# Patient Record
Sex: Female | Born: 1935
Health system: Southern US, Community
[De-identification: ages and names within clinical notes are randomized; demographics above are authoritative.]

## PROBLEM LIST (undated history)

## (undated) DIAGNOSIS — D126 Benign neoplasm of colon, unspecified: Secondary | ICD-10-CM

## (undated) DIAGNOSIS — I10 Essential (primary) hypertension: Secondary | ICD-10-CM

## (undated) DIAGNOSIS — K5790 Diverticulosis of intestine, part unspecified, without perforation or abscess without bleeding: Secondary | ICD-10-CM

## (undated) DIAGNOSIS — I639 Cerebral infarction, unspecified: Secondary | ICD-10-CM

## (undated) DIAGNOSIS — F419 Anxiety disorder, unspecified: Secondary | ICD-10-CM

## (undated) DIAGNOSIS — E039 Hypothyroidism, unspecified: Secondary | ICD-10-CM

## (undated) DIAGNOSIS — E785 Hyperlipidemia, unspecified: Secondary | ICD-10-CM

## (undated) DIAGNOSIS — K219 Gastro-esophageal reflux disease without esophagitis: Secondary | ICD-10-CM

## (undated) DIAGNOSIS — M1990 Unspecified osteoarthritis, unspecified site: Secondary | ICD-10-CM

## (undated) DIAGNOSIS — I493 Ventricular premature depolarization: Secondary | ICD-10-CM

## (undated) HISTORY — DX: Benign neoplasm of colon, unspecified: D12.6

## (undated) HISTORY — DX: Ventricular premature depolarization: I49.3

## (undated) HISTORY — PX: BREAST CYST EXCISION: SHX579

## (undated) HISTORY — DX: Hyperlipidemia, unspecified: E78.5

## (undated) HISTORY — PX: APPENDECTOMY: SHX54

## (undated) HISTORY — PX: BREAST CYST ASPIRATION: SHX578

## (undated) HISTORY — DX: Essential (primary) hypertension: I10

## (undated) HISTORY — PX: CATARACT EXTRACTION: SUR2

## (undated) HISTORY — PX: GANGLION CYST EXCISION: SHX1691

## (undated) HISTORY — PX: LUNG REMOVAL, PARTIAL: SHX233

## (undated) HISTORY — DX: Anxiety disorder, unspecified: F41.9

## (undated) HISTORY — DX: Gastro-esophageal reflux disease without esophagitis: K21.9

## (undated) HISTORY — DX: Diverticulosis of intestine, part unspecified, without perforation or abscess without bleeding: K57.90

## (undated) HISTORY — DX: Cerebral infarction, unspecified: I63.9

---

## 1984-07-28 HISTORY — PX: CHOLECYSTECTOMY: SHX55

## 1985-07-28 HISTORY — PX: BREAST EXCISIONAL BIOPSY: SUR124

## 1990-07-28 HISTORY — PX: BREAST EXCISIONAL BIOPSY: SUR124

## 1994-07-28 HISTORY — PX: BREAST EXCISIONAL BIOPSY: SUR124

## 1997-11-03 ENCOUNTER — Ambulatory Visit (HOSPITAL_COMMUNITY): Admission: RE | Admit: 1997-11-03 | Discharge: 1997-11-03 | Payer: Self-pay | Admitting: Family Medicine

## 1997-11-09 ENCOUNTER — Other Ambulatory Visit: Admission: RE | Admit: 1997-11-09 | Discharge: 1997-11-09 | Payer: Self-pay | Admitting: Family Medicine

## 1998-03-22 ENCOUNTER — Ambulatory Visit (HOSPITAL_COMMUNITY): Admission: RE | Admit: 1998-03-22 | Discharge: 1998-03-22 | Payer: Self-pay | Admitting: Pulmonary Disease

## 1998-04-03 ENCOUNTER — Ambulatory Visit (HOSPITAL_COMMUNITY): Admission: RE | Admit: 1998-04-03 | Discharge: 1998-04-03 | Payer: Self-pay | Admitting: Family Medicine

## 1998-04-17 ENCOUNTER — Ambulatory Visit (HOSPITAL_COMMUNITY): Admission: RE | Admit: 1998-04-17 | Discharge: 1998-04-17 | Payer: Self-pay | Admitting: Family Medicine

## 1998-04-25 ENCOUNTER — Ambulatory Visit (HOSPITAL_COMMUNITY): Admission: RE | Admit: 1998-04-25 | Discharge: 1998-04-25 | Payer: Self-pay | Admitting: Pulmonary Disease

## 1998-05-22 ENCOUNTER — Ambulatory Visit (HOSPITAL_COMMUNITY): Admission: RE | Admit: 1998-05-22 | Discharge: 1998-05-22 | Payer: Self-pay | Admitting: Endocrinology

## 1998-05-22 ENCOUNTER — Encounter: Payer: Self-pay | Admitting: Endocrinology

## 1998-06-11 ENCOUNTER — Encounter: Payer: Self-pay | Admitting: Pulmonary Disease

## 1998-06-11 ENCOUNTER — Ambulatory Visit (HOSPITAL_COMMUNITY): Admission: RE | Admit: 1998-06-11 | Discharge: 1998-06-11 | Payer: Self-pay | Admitting: Pulmonary Disease

## 1998-06-15 ENCOUNTER — Encounter: Payer: Self-pay | Admitting: Cardiothoracic Surgery

## 1998-06-18 ENCOUNTER — Inpatient Hospital Stay (HOSPITAL_COMMUNITY): Admission: RE | Admit: 1998-06-18 | Discharge: 1998-06-22 | Payer: Self-pay | Admitting: Cardiothoracic Surgery

## 1998-06-18 ENCOUNTER — Encounter: Payer: Self-pay | Admitting: Cardiothoracic Surgery

## 1998-06-19 ENCOUNTER — Encounter: Payer: Self-pay | Admitting: Cardiothoracic Surgery

## 1998-06-20 ENCOUNTER — Encounter: Payer: Self-pay | Admitting: Cardiothoracic Surgery

## 1998-11-12 ENCOUNTER — Encounter: Payer: Self-pay | Admitting: Gastroenterology

## 1998-11-12 ENCOUNTER — Ambulatory Visit (HOSPITAL_COMMUNITY): Admission: RE | Admit: 1998-11-12 | Discharge: 1998-11-12 | Payer: Self-pay | Admitting: Gastroenterology

## 1998-11-14 ENCOUNTER — Ambulatory Visit (HOSPITAL_COMMUNITY): Admission: RE | Admit: 1998-11-14 | Discharge: 1998-11-14 | Payer: Self-pay | Admitting: Gastroenterology

## 1998-11-14 ENCOUNTER — Encounter: Payer: Self-pay | Admitting: Gastroenterology

## 1998-11-29 ENCOUNTER — Other Ambulatory Visit: Admission: RE | Admit: 1998-11-29 | Discharge: 1998-11-29 | Payer: Self-pay | Admitting: Family Medicine

## 1999-11-15 ENCOUNTER — Encounter: Admission: RE | Admit: 1999-11-15 | Discharge: 1999-11-15 | Payer: Self-pay | Admitting: Family Medicine

## 1999-11-15 ENCOUNTER — Encounter: Payer: Self-pay | Admitting: Family Medicine

## 1999-12-02 ENCOUNTER — Other Ambulatory Visit: Admission: RE | Admit: 1999-12-02 | Discharge: 1999-12-02 | Payer: Self-pay | Admitting: Family Medicine

## 2000-03-06 ENCOUNTER — Ambulatory Visit (HOSPITAL_COMMUNITY): Admission: RE | Admit: 2000-03-06 | Discharge: 2000-03-06 | Payer: Self-pay | Admitting: Gastroenterology

## 2000-03-06 ENCOUNTER — Encounter: Payer: Self-pay | Admitting: Gastroenterology

## 2000-03-24 ENCOUNTER — Other Ambulatory Visit: Admission: RE | Admit: 2000-03-24 | Discharge: 2000-03-24 | Payer: Self-pay | Admitting: Gastroenterology

## 2000-03-24 ENCOUNTER — Encounter (INDEPENDENT_AMBULATORY_CARE_PROVIDER_SITE_OTHER): Payer: Self-pay | Admitting: Specialist

## 2000-06-10 ENCOUNTER — Encounter: Payer: Self-pay | Admitting: General Surgery

## 2000-06-10 ENCOUNTER — Encounter: Admission: RE | Admit: 2000-06-10 | Discharge: 2000-06-10 | Payer: Self-pay | Admitting: General Surgery

## 2000-07-28 HISTORY — PX: OTHER SURGICAL HISTORY: SHX169

## 2000-07-28 HISTORY — PX: ABDOMINAL HYSTERECTOMY: SHX81

## 2001-01-06 ENCOUNTER — Other Ambulatory Visit: Admission: RE | Admit: 2001-01-06 | Discharge: 2001-01-06 | Payer: Self-pay | Admitting: Family Medicine

## 2001-01-12 ENCOUNTER — Encounter: Admission: RE | Admit: 2001-01-12 | Discharge: 2001-01-12 | Payer: Self-pay | Admitting: Family Medicine

## 2001-01-12 ENCOUNTER — Encounter: Payer: Self-pay | Admitting: Family Medicine

## 2001-12-17 ENCOUNTER — Encounter: Payer: Self-pay | Admitting: Family Medicine

## 2001-12-17 ENCOUNTER — Encounter: Admission: RE | Admit: 2001-12-17 | Discharge: 2001-12-17 | Payer: Self-pay | Admitting: Family Medicine

## 2002-01-12 ENCOUNTER — Other Ambulatory Visit: Admission: RE | Admit: 2002-01-12 | Discharge: 2002-01-12 | Payer: Self-pay | Admitting: Family Medicine

## 2002-02-01 ENCOUNTER — Encounter: Admission: RE | Admit: 2002-02-01 | Discharge: 2002-02-01 | Payer: Self-pay | Admitting: Family Medicine

## 2002-02-01 ENCOUNTER — Encounter: Payer: Self-pay | Admitting: Family Medicine

## 2002-03-29 ENCOUNTER — Encounter: Admission: RE | Admit: 2002-03-29 | Discharge: 2002-03-29 | Payer: Self-pay | Admitting: Family Medicine

## 2002-03-29 ENCOUNTER — Encounter: Payer: Self-pay | Admitting: Family Medicine

## 2002-10-26 ENCOUNTER — Ambulatory Visit (HOSPITAL_COMMUNITY): Admission: RE | Admit: 2002-10-26 | Discharge: 2002-10-26 | Payer: Self-pay | Admitting: Family Medicine

## 2003-02-16 ENCOUNTER — Other Ambulatory Visit: Admission: RE | Admit: 2003-02-16 | Discharge: 2003-02-16 | Payer: Self-pay | Admitting: Family Medicine

## 2003-03-10 ENCOUNTER — Encounter: Admission: RE | Admit: 2003-03-10 | Discharge: 2003-03-10 | Payer: Self-pay | Admitting: Family Medicine

## 2003-03-10 ENCOUNTER — Encounter: Payer: Self-pay | Admitting: Family Medicine

## 2003-05-04 ENCOUNTER — Encounter: Admission: RE | Admit: 2003-05-04 | Discharge: 2003-05-04 | Payer: Self-pay | Admitting: Family Medicine

## 2003-05-04 ENCOUNTER — Encounter: Payer: Self-pay | Admitting: Family Medicine

## 2003-05-08 ENCOUNTER — Observation Stay (HOSPITAL_COMMUNITY): Admission: RE | Admit: 2003-05-08 | Discharge: 2003-05-09 | Payer: Self-pay | Admitting: Urology

## 2004-02-15 ENCOUNTER — Encounter: Admission: RE | Admit: 2004-02-15 | Discharge: 2004-02-15 | Payer: Self-pay | Admitting: Family Medicine

## 2004-02-20 ENCOUNTER — Other Ambulatory Visit: Admission: RE | Admit: 2004-02-20 | Discharge: 2004-02-20 | Payer: Self-pay | Admitting: Family Medicine

## 2004-06-04 ENCOUNTER — Encounter: Admission: RE | Admit: 2004-06-04 | Discharge: 2004-06-04 | Payer: Self-pay | Admitting: Family Medicine

## 2005-02-21 ENCOUNTER — Other Ambulatory Visit: Admission: RE | Admit: 2005-02-21 | Discharge: 2005-02-21 | Payer: Self-pay | Admitting: Family Medicine

## 2005-04-08 ENCOUNTER — Ambulatory Visit: Payer: Self-pay | Admitting: Internal Medicine

## 2005-06-10 ENCOUNTER — Encounter: Admission: RE | Admit: 2005-06-10 | Discharge: 2005-06-10 | Payer: Self-pay | Admitting: Internal Medicine

## 2005-11-26 ENCOUNTER — Encounter: Payer: Self-pay | Admitting: Family Medicine

## 2006-01-14 ENCOUNTER — Ambulatory Visit: Payer: Self-pay | Admitting: Gastroenterology

## 2006-01-23 ENCOUNTER — Encounter (INDEPENDENT_AMBULATORY_CARE_PROVIDER_SITE_OTHER): Payer: Self-pay | Admitting: Gastroenterology

## 2006-01-23 ENCOUNTER — Ambulatory Visit: Payer: Self-pay | Admitting: Gastroenterology

## 2006-06-25 ENCOUNTER — Encounter: Admission: RE | Admit: 2006-06-25 | Discharge: 2006-06-25 | Payer: Self-pay | Admitting: Family Medicine

## 2007-06-07 ENCOUNTER — Encounter: Admission: RE | Admit: 2007-06-07 | Discharge: 2007-06-07 | Payer: Self-pay | Admitting: Family Medicine

## 2007-07-05 ENCOUNTER — Encounter: Admission: RE | Admit: 2007-07-05 | Discharge: 2007-07-05 | Payer: Self-pay | Admitting: Family Medicine

## 2007-07-29 HISTORY — PX: OTHER SURGICAL HISTORY: SHX169

## 2007-10-05 ENCOUNTER — Encounter: Admission: RE | Admit: 2007-10-05 | Discharge: 2007-10-05 | Payer: Self-pay | Admitting: Family Medicine

## 2008-02-11 ENCOUNTER — Encounter: Admission: RE | Admit: 2008-02-11 | Discharge: 2008-02-11 | Payer: Self-pay | Admitting: Family Medicine

## 2008-07-05 ENCOUNTER — Encounter: Admission: RE | Admit: 2008-07-05 | Discharge: 2008-07-05 | Payer: Self-pay | Admitting: Family Medicine

## 2009-02-22 ENCOUNTER — Encounter: Admission: RE | Admit: 2009-02-22 | Discharge: 2009-02-22 | Payer: Self-pay | Admitting: Family Medicine

## 2009-07-06 ENCOUNTER — Encounter: Admission: RE | Admit: 2009-07-06 | Discharge: 2009-07-06 | Payer: Self-pay | Admitting: Family Medicine

## 2010-07-11 ENCOUNTER — Encounter
Admission: RE | Admit: 2010-07-11 | Discharge: 2010-07-11 | Payer: Self-pay | Source: Home / Self Care | Attending: Family Medicine | Admitting: Family Medicine

## 2010-08-14 ENCOUNTER — Other Ambulatory Visit: Payer: Self-pay

## 2010-12-13 NOTE — Op Note (Signed)
NAME:  Jasmine Smith, Jasmine Smith                            ACCOUNT NO.:  [id number]   MEDICAL RECORD NO.:  [id number]                   PATIENT TYPE:  AMB   LOCATION:  DAY                                  FACILITY:  [facility]   PHYSICIAN:  Sigmund I. Patsi Sears, M.D.         DATE OF BIRTH:  02-26-1936   DATE OF PROCEDURE:  05/08/2003  DATE OF DISCHARGE:                                 OPERATIVE REPORT   PREOPERATIVE DIAGNOSIS:  Pelvic prolapse with cystourethrocele and stress  urinary incontinence.   POSTOPERATIVE DIAGNOSIS:  Pelvic prolapse with cystourethrocele and stress  urinary incontinence.   OPERATION/PROCEDURE:  Mentor obturator transvaginal sling and anterior  vaginal vault repair.   SURGEON:  Sigmund I. Patsi Sears, M.D.   ANESTHESIA:  General LMA.   PREPARATION:  After appropriate preanesthesia, the patient was brought to  the operating room and placed on the operating table in the dorsal supine  position where general LMA anesthesia was introduced.  She was then replaced  in the dorsal lithotomy position where the pubis was prepped with Betadine  solution and draped in the usual fashion.   REVIEW OF HISTORY:  This 75 year old female has history of hysterectomy,  anterior vaginal vault repair and open bladder repair in 1987, with current  cough, laugh, sneezing contents which is bothersome to her.  She has had  grade 3 cystourethrocele, and is for anterior vaginal vault repair and  transobturator pubovaginal sling.   DESCRIPTION OF PROCEDURE:  Ten mL of Marcaine 0.25% with epinephrine  1:200,000 is injected into transvaginal, epithelial tissue.  The incision is  made measuring 1.5 cm over the distal and mid urethra and subcutaneous  tissue is dissected with the electrosurgical unit.  Finger dissection is  accomplished to the retropubic space bilaterally.  There is virtually no  tissue in the retropubic space.  Transobturator tape is then passed by  making two stab incisions 5 cm  lateral to the clitoris bilaterally, and  using the transobturator sling equipment, the tape is passed.  The regular  clamp is kept behind the sling.  Cystoscopy is accomplished and shows that  the sling is in excellent position with no evidence of bladder injury.  The  sling is then cut subcutaneously.  The wound  is closed in two layers with 2-  0 and 3-0 Vicryl sutures.   A 15 cm incision is then made on the midline back to the level of the  cervical fascia and subcutaneous tissue was dissected with sharp and blunt  dissection.  A anterior vaginal vault repair is accomplished with horizontal  mattress Kelly plication sutures.  The redundant vaginal epithelium is  removed and the wound closed with running 3-0 Vicryl suture.  The vaginal  packing is placed with Estrace, and the patient is awakened and taken to the  recovery room in excellent condition.  Sigmund I. Patsi Sears, M.D.    SIT/MEDQ  D:  05/08/2003  T:  05/08/2003  Job:  [id number]

## 2011-03-07 ENCOUNTER — Other Ambulatory Visit: Payer: Self-pay | Admitting: Family Medicine

## 2011-03-07 DIAGNOSIS — R209 Unspecified disturbances of skin sensation: Secondary | ICD-10-CM

## 2011-03-14 ENCOUNTER — Ambulatory Visit
Admission: RE | Admit: 2011-03-14 | Discharge: 2011-03-14 | Disposition: A | Discharge status: Home / Self Care | Payer: Medicare Other | Source: Ambulatory Visit | Attending: Family Medicine | Admitting: Family Medicine

## 2011-03-14 ENCOUNTER — Encounter: Payer: Self-pay | Admitting: Gastroenterology

## 2011-03-14 ENCOUNTER — Other Ambulatory Visit: Payer: Self-pay

## 2011-03-14 DIAGNOSIS — R209 Unspecified disturbances of skin sensation: Secondary | ICD-10-CM

## 2011-04-23 ENCOUNTER — Ambulatory Visit (HOSPITAL_COMMUNITY)
Admission: RE | Admit: 2011-04-23 | Discharge: 2011-04-23 | Disposition: A | Discharge status: Home / Self Care | Payer: Medicare Other | Source: Ambulatory Visit | Attending: Neurology | Admitting: Neurology

## 2011-04-23 DIAGNOSIS — I080 Rheumatic disorders of both mitral and aortic valves: Secondary | ICD-10-CM | POA: Insufficient documentation

## 2011-04-23 DIAGNOSIS — R5381 Other malaise: Secondary | ICD-10-CM | POA: Insufficient documentation

## 2011-04-23 DIAGNOSIS — I059 Rheumatic mitral valve disease, unspecified: Secondary | ICD-10-CM

## 2011-04-23 DIAGNOSIS — I079 Rheumatic tricuspid valve disease, unspecified: Secondary | ICD-10-CM | POA: Insufficient documentation

## 2011-04-29 ENCOUNTER — Telehealth: Payer: Self-pay

## 2011-04-29 NOTE — Telephone Encounter (Signed)
Echo faxed to Dana/Dr.Yen Office @ [phone]  04/29/11/km

## 2011-06-04 ENCOUNTER — Other Ambulatory Visit: Payer: Self-pay | Admitting: Family Medicine

## 2011-06-04 DIAGNOSIS — Z1231 Encounter for screening mammogram for malignant neoplasm of breast: Secondary | ICD-10-CM

## 2011-06-25 ENCOUNTER — Encounter: Payer: Self-pay | Admitting: *Deleted

## 2011-06-26 ENCOUNTER — Ambulatory Visit (INDEPENDENT_AMBULATORY_CARE_PROVIDER_SITE_OTHER): Payer: Medicare Other | Admitting: Cardiology

## 2011-06-26 ENCOUNTER — Encounter: Payer: Self-pay | Admitting: Cardiology

## 2011-06-26 VITALS — BP 122/80 | HR 51 | Ht 63.0 in | Wt 151.4 lb

## 2011-06-26 DIAGNOSIS — R5381 Other malaise: Secondary | ICD-10-CM

## 2011-06-26 DIAGNOSIS — R5383 Other fatigue: Secondary | ICD-10-CM

## 2011-06-26 DIAGNOSIS — E785 Hyperlipidemia, unspecified: Secondary | ICD-10-CM

## 2011-06-26 DIAGNOSIS — M542 Cervicalgia: Secondary | ICD-10-CM

## 2011-06-26 DIAGNOSIS — I4949 Other premature depolarization: Secondary | ICD-10-CM

## 2011-06-26 DIAGNOSIS — I493 Ventricular premature depolarization: Secondary | ICD-10-CM

## 2011-06-26 DIAGNOSIS — R531 Weakness: Secondary | ICD-10-CM

## 2011-06-26 MED ORDER — METOPROLOL SUCCINATE ER 25 MG PO TB24: 25.0000 mg | ORAL_TABLET | Freq: Every day | ORAL | Status: DC

## 2011-06-26 NOTE — Assessment & Plan Note (Signed)
Management per primary care. 

## 2011-06-26 NOTE — Assessment & Plan Note (Signed)
Etiology unclear. Her blood pressure is equal in both upper extremities. Neurology evaluation apparently unremarkable. There was a question of whether advanced her bradycardia mediated. I have not seen a similar presentation related to low heart rate. She had no presyncope or syncope. She had bilateral upper extremity numbness and weakness. She states she could not place her arms on the table because of her weakness. She did describe neck tightness and states "I thought I was having a heart attack". We'll arrange stress echocardiogram. Given mildly reduced heart rate will decrease metoprolol to 12.5 mg b.i.d. I am not convinced symptoms are cardiac related.

## 2011-06-26 NOTE — Assessment & Plan Note (Signed)
Continue beta blocker at lower dose given mild bradycardia.

## 2011-06-26 NOTE — Patient Instructions (Signed)
Your physician recommends that you schedule a follow-up appointment in: AS NEEDED  Your physician has requested that you have a stress echocardiogram. For further information please visit [email or web address] Please follow instruction sheet as given.   CHANGE TO METOPROLOL SUCC 25 MG ONCE DAILY

## 2011-06-26 NOTE — Progress Notes (Signed)
HPI: 75 year old female I am asked to evaluate for bilateral upper extremity weakness and numbness. Patient recently seen by neurology with the above complaint. Based on their office notes, she had an MRI of the brain that demonstrated mild atrophy with moderate small vessel disease. MRI of the cervical spine showed multilevel degenerative disc disease. There was moderate canal stenosis. Carotid Dopplers apparently normal. Echocardiogram in September of 2012 showed normal LV function, mild mitral regurgitation and trace aortic insufficiency. Patient states that in June she had her first episode. She awoke from sleep and was walking to the bathroom. She developed bilateral upper extremity numbness and weakness. There was no associated chest pain, shortness of breath, palpitations or syncope. Her arms felt heavy and there was mild neck tightness. Her symptoms lasted for approximately 15 seconds and resolved. No weakness in her lower extremities, incontinence or dysarthria. She had 2 subsequent episodes the most recent of which was in August. Because of the above we were asked to further evaluate. She denies dyspnea on exertion or exertional chest pain. Her palpitations are reasonably well controlled on present dose of Lopressor.  Current Outpatient Prescriptions  Medication Sig Dispense Refill  . aspirin 81 MG tablet Take 81 mg by mouth daily.        . Biotin 5000 MCG CAPS Take 1 capsule by mouth.        . Coenzyme Q10 (COQ10) 100 MG CAPS Take 1 capsule by mouth daily.        Marland Kitchen KRILL OIL PO Take 1 capsule by mouth daily.        . metoprolol (LOPRESSOR) 50 MG tablet Take 25 mg by mouth 2 (two) times daily.        . Multiple Vitamin (MULTIVITAMIN) tablet Take 1 tablet by mouth daily.        . simvastatin (ZOCOR) 20 MG tablet Take 20 mg by mouth 2 (two) times daily.          Allergies  Allergen Reactions  . Biaxin   . Levaquin     Poor Indigestion   . Sulfa Drugs Cross Reactors     Past Medical  History  Diagnosis Date  . Hyperlipidemia   . PVC (premature ventricular contraction)     Past Surgical History  Procedure Date  . Lung removal, partial     RML  . Cholecystectomy   . Abdominal hysterectomy   . Right rotator cuff surgery   . Bladder tack   . Right hand surgery   . Appendectomy     History   Social History  . Marital Status: Married    Spouse Name: N/A    Number of Children: 3  . Years of Education: N/A   Occupational History  .     Social History Main Topics  . Smoking status: Never Smoker   . Smokeless tobacco: Not on file  . Alcohol Use: No  . Drug Use: Not on file  . Sexually Active: Not on file   Other Topics Concern  . Not on file   Social History Narrative  . No narrative on file    Family History  Problem Relation Age of Onset  . Heart attack Brother     Died of MI at age 41    ROS: no fevers or chills, productive cough, hemoptysis, dysphasia, odynophagia, melena, hematochezia, dysuria, hematuria, rash, seizure activity, orthopnea, PND, pedal edema, claudication. Remaining systems are negative.  Physical Exam:  Blood pressure 122/80, pulse 51, height 5\' 3"  (  1.6 m), weight 151 lb 6.4 oz (68.675 kg). Systolic blood pressure equal in both upper extremities. General:  Well developed/well nourished in NAD Skin warm/dry Patient not depressed No peripheral clubbing Back-normal HEENT-normal/normal eyelids Neck supple/normal carotid upstroke bilaterally; no bruits; no JVD; no thyromegaly chest - CTA/ normal expansion CV - RRR/normal S1 and S2; no murmurs, rubs or gallops;  PMI nondisplaced Abdomen -NT/ND, no HSM, no mass, + bowel sounds, no bruit 2+ femoral pulses, no bruits Ext-no edema, chords, 2+ DP Neuro-grossly nonfocal  ECG Sinus bradycardi, no ST changes.

## 2011-07-04 ENCOUNTER — Ambulatory Visit (HOSPITAL_COMMUNITY): Payer: Medicare Other | Attending: Cardiology | Admitting: Radiology

## 2011-07-04 DIAGNOSIS — Z8249 Family history of ischemic heart disease and other diseases of the circulatory system: Secondary | ICD-10-CM | POA: Insufficient documentation

## 2011-07-04 DIAGNOSIS — E785 Hyperlipidemia, unspecified: Secondary | ICD-10-CM | POA: Insufficient documentation

## 2011-07-04 DIAGNOSIS — M542 Cervicalgia: Secondary | ICD-10-CM | POA: Insufficient documentation

## 2011-07-04 DIAGNOSIS — I2510 Atherosclerotic heart disease of native coronary artery without angina pectoris: Secondary | ICD-10-CM

## 2011-07-24 ENCOUNTER — Ambulatory Visit: Payer: Medicare Other

## 2011-08-14 ENCOUNTER — Ambulatory Visit
Admission: RE | Admit: 2011-08-14 | Discharge: 2011-08-14 | Disposition: A | Discharge status: Home / Self Care | Payer: Medicare Other | Source: Ambulatory Visit | Attending: Family Medicine | Admitting: Family Medicine

## 2011-08-14 DIAGNOSIS — Z1231 Encounter for screening mammogram for malignant neoplasm of breast: Secondary | ICD-10-CM

## 2012-02-17 ENCOUNTER — Other Ambulatory Visit: Payer: Self-pay | Admitting: Family Medicine

## 2012-02-17 DIAGNOSIS — R319 Hematuria, unspecified: Secondary | ICD-10-CM

## 2012-02-17 DIAGNOSIS — R109 Unspecified abdominal pain: Secondary | ICD-10-CM

## 2012-02-20 ENCOUNTER — Ambulatory Visit
Admission: RE | Admit: 2012-02-20 | Discharge: 2012-02-20 | Disposition: A | Discharge status: Home / Self Care | Payer: Medicare Other | Source: Ambulatory Visit | Attending: Family Medicine | Admitting: Family Medicine

## 2012-02-20 DIAGNOSIS — R109 Unspecified abdominal pain: Secondary | ICD-10-CM

## 2012-02-20 DIAGNOSIS — R319 Hematuria, unspecified: Secondary | ICD-10-CM

## 2012-04-15 ENCOUNTER — Encounter: Payer: Self-pay | Admitting: Gastroenterology

## 2012-05-06 ENCOUNTER — Telehealth: Payer: Self-pay | Admitting: Gastroenterology

## 2012-05-06 NOTE — Telephone Encounter (Signed)
Please cancel all [facility] recalls as she has changed care to [facility].

## 2012-05-07 NOTE — Telephone Encounter (Signed)
Cancelled all recalls in the system

## 2012-06-27 ENCOUNTER — Encounter (HOSPITAL_BASED_OUTPATIENT_CLINIC_OR_DEPARTMENT_OTHER): Payer: Self-pay | Admitting: Emergency Medicine

## 2012-06-27 ENCOUNTER — Emergency Department (HOSPITAL_BASED_OUTPATIENT_CLINIC_OR_DEPARTMENT_OTHER)
Admission: EM | Admit: 2012-06-27 | Discharge: 2012-06-27 | Disposition: A | Discharge status: Home / Self Care | Payer: Medicare Other | Attending: Emergency Medicine | Admitting: Emergency Medicine

## 2012-06-27 ENCOUNTER — Emergency Department (HOSPITAL_BASED_OUTPATIENT_CLINIC_OR_DEPARTMENT_OTHER): Payer: Medicare Other

## 2012-06-27 DIAGNOSIS — Y9389 Activity, other specified: Secondary | ICD-10-CM | POA: Insufficient documentation

## 2012-06-27 DIAGNOSIS — S139XXA Sprain of joints and ligaments of unspecified parts of neck, initial encounter: Secondary | ICD-10-CM | POA: Insufficient documentation

## 2012-06-27 DIAGNOSIS — S298XXA Other specified injuries of thorax, initial encounter: Secondary | ICD-10-CM | POA: Insufficient documentation

## 2012-06-27 DIAGNOSIS — Z7982 Long term (current) use of aspirin: Secondary | ICD-10-CM | POA: Insufficient documentation

## 2012-06-27 DIAGNOSIS — S161XXA Strain of muscle, fascia and tendon at neck level, initial encounter: Secondary | ICD-10-CM

## 2012-06-27 DIAGNOSIS — Z79899 Other long term (current) drug therapy: Secondary | ICD-10-CM | POA: Insufficient documentation

## 2012-06-27 DIAGNOSIS — Z8679 Personal history of other diseases of the circulatory system: Secondary | ICD-10-CM | POA: Insufficient documentation

## 2012-06-27 DIAGNOSIS — E785 Hyperlipidemia, unspecified: Secondary | ICD-10-CM | POA: Insufficient documentation

## 2012-06-27 MED ORDER — IBUPROFEN 400 MG PO TABS
600.0000 mg | ORAL_TABLET | Freq: Once | ORAL | Status: AC
  Administered 2012-06-27: 600 mg via ORAL
  Filled 2012-06-27: qty 1

## 2012-06-27 MED ORDER — IBUPROFEN 600 MG PO TABS: 600.0000 mg | ORAL_TABLET | Freq: Four times a day (QID) | ORAL | Status: DC | PRN
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
## 2012-06-27 NOTE — ED Notes (Signed)
C-collar remains in place. Pt c/o chest soreness. Husband at bedside and requesting something to eat and drink. Given ginger ale and crackers.
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
## 2012-06-27 NOTE — ED Notes (Signed)
Patient transported to X-ray via stretcher 

## 2012-06-29 NOTE — ED Provider Notes (Signed)
Medical screening examination/treatment/procedure(s) were performed by non-physician practitioner and as supervising physician I was immediately available for consultation/collaboration.  Sharesa Kemp, MD 06/29/12 2257 

## 2012-07-22 ENCOUNTER — Other Ambulatory Visit: Payer: Self-pay | Admitting: Family Medicine

## 2012-07-22 DIAGNOSIS — Z1231 Encounter for screening mammogram for malignant neoplasm of breast: Secondary | ICD-10-CM

## 2012-08-18 ENCOUNTER — Ambulatory Visit
Admission: RE | Admit: 2012-08-18 | Discharge: 2012-08-18 | Disposition: A | Discharge status: Home / Self Care | Payer: Medicare Other | Source: Ambulatory Visit | Attending: Family Medicine | Admitting: Family Medicine

## 2012-08-18 DIAGNOSIS — Z1231 Encounter for screening mammogram for malignant neoplasm of breast: Secondary | ICD-10-CM

## 2013-04-19 ENCOUNTER — Other Ambulatory Visit: Payer: Self-pay | Admitting: Family Medicine

## 2013-04-19 ENCOUNTER — Ambulatory Visit
Admission: RE | Admit: 2013-04-19 | Discharge: 2013-04-19 | Disposition: A | Discharge status: Home / Self Care | Payer: Medicare Other | Source: Ambulatory Visit | Attending: Family Medicine | Admitting: Family Medicine

## 2013-04-19 DIAGNOSIS — M25551 Pain in right hip: Secondary | ICD-10-CM

## 2013-08-04 ENCOUNTER — Other Ambulatory Visit: Payer: Self-pay

## 2013-08-04 DIAGNOSIS — Z1231 Encounter for screening mammogram for malignant neoplasm of breast: Secondary | ICD-10-CM

## 2013-08-25 ENCOUNTER — Ambulatory Visit
Admission: RE | Admit: 2013-08-25 | Discharge: 2013-08-25 | Disposition: A | Discharge status: Home / Self Care | Payer: Medicare Other | Source: Ambulatory Visit

## 2013-08-25 DIAGNOSIS — Z1231 Encounter for screening mammogram for malignant neoplasm of breast: Secondary | ICD-10-CM

## 2013-10-04 ENCOUNTER — Encounter: Payer: Self-pay | Admitting: *Deleted

## 2013-10-06 ENCOUNTER — Encounter: Payer: Self-pay | Admitting: Neurology

## 2013-10-06 ENCOUNTER — Ambulatory Visit (INDEPENDENT_AMBULATORY_CARE_PROVIDER_SITE_OTHER): Payer: Medicare Other | Admitting: Neurology

## 2013-10-06 ENCOUNTER — Encounter (INDEPENDENT_AMBULATORY_CARE_PROVIDER_SITE_OTHER): Payer: Self-pay

## 2013-10-06 VITALS — BP 111/66 | HR 56 | Ht 62.0 in | Wt 152.0 lb

## 2013-10-06 DIAGNOSIS — R531 Weakness: Secondary | ICD-10-CM

## 2013-10-06 DIAGNOSIS — E785 Hyperlipidemia, unspecified: Secondary | ICD-10-CM

## 2013-10-06 DIAGNOSIS — R5381 Other malaise: Secondary | ICD-10-CM

## 2013-10-06 DIAGNOSIS — R5383 Other fatigue: Secondary | ICD-10-CM

## 2013-10-06 DIAGNOSIS — R29898 Other symptoms and signs involving the musculoskeletal system: Secondary | ICD-10-CM

## 2013-10-06 NOTE — Progress Notes (Signed)
PATIENT: Jasmine Smith DOB: November 12, 1935  HISTORICAL  Jasmine Smith is a 78 year old right-handed Caucasian female return for followup of acute onset right arm weakness,  I saw her in Oct 2012, she was referred by her primary care physician for evaluation of episodes of acute onset bilateral upper extremity weakness.  She has past medical history of hyperlipidemia, with frequent PVCs, in Summer of 2012, she had 3 episodes of sudden onset bilateral upper extremity weakness, to the point difficulty raising her arm overhead, lasting a few seconds there was no numbness, no loss of consciousness, she denies gait difficulty incontinence, in between episodes she was asymptomatic there was no associated chest pain heart palpitations  She had MRI of the brain without contrast at [facility] imaging has demonstrated mild atrophy, with moderate small vessel disease,  MRI of cervical multilevel spondylosis with posterior osteophytes contributing to moderate spinal stenosis at C5-C6 and C6-C7. No cord deformity is present.  Uncinate spurring and facet hypertrophy contribute to mild to moderate foraminal narrowing as described above, most advanced on the left at C5-C6.  Echocardiogram has demonstrated ejection fraction of 55%, mild aortic valve and mitral valve regurgitation.   Ultrasound of carotid artery was normal   She was evaluated by Dr. Stanford Breed in Nov 2012, stress ECHO showed   no definite scar or ischemia, etiology of her sudden onset bilateral arm, and generalized weakness remain unknown,   UPDATE March 12 th 2015:  She had been symptoms for a from 2012-2015, she had one episode in Early March 2015, but this time only involving her right arm  She was in sleep, tried to turn, then realizied that right arm was dead, she could not picked up her right arm, no weakness on her left arm or legs, she started to using left arm to rub her right arm, her right arm could not feel her hand, she got up using  bathroom, everything went back to normal again, she denies chest pain, no neck pain, no gait difficulty, the symptom lasted less than one minute, then she is back to normal.   She denies gait difficulty, she does history of bladder sling, she has urinary urgency occasionally, mild constipation.   She has no visual loss.   REVIEW OF SYSTEMS: Full 14 system review of systems performed and notable only for anxiety, allergies,  ALLERGIES: Allergies  Allergen Reactions  . Clarithromycin   . Clarithromycin   . Levofloxacin     Poor Indigestion   . Levaquin  [Levofloxacin In D5w]   . Sulfa Antibiotics   . Sulfa Drugs Cross Reactors     HOME MEDICATIONS: Current Outpatient Prescriptions on File Prior to Visit  Medication Sig Dispense Refill  . aspirin 81 MG tablet Take 81 mg by mouth daily.        . Biotin 5000 MCG CAPS Take 1 capsule by mouth.        . Coenzyme Q10 (COQ10) 100 MG CAPS Take 1 capsule by mouth daily.        Marland Kitchen ibuprofen (ADVIL,MOTRIN) 600 MG tablet Take 1 tablet (600 mg total) by mouth every 6 (six) hours as needed for pain.  30 tablet  0  . KRILL OIL PO Take 1 capsule by mouth daily.        . metoprolol succinate (TOPROL XL) 25 MG 24 hr tablet Take 1 tablet (25 mg total) by mouth daily.  30 tablet  11  . Multiple Vitamin (MULTIVITAMIN) tablet Take 1 tablet  by mouth daily.        . simvastatin (ZOCOR) 20 MG tablet Take 20 mg by mouth 2 (two) times daily.         No current facility-administered medications on file prior to visit.    PAST MEDICAL HISTORY: Past Medical History  Diagnosis Date  . Hyperlipidemia   . PVC (premature ventricular contraction)   . Esophageal reflux   . CVA (cerebral infarction)     PAST SURGICAL HISTORY: Past Surgical History  Procedure Laterality Date  . Lung removal, partial      RML  . Cholecystectomy    . Abdominal hysterectomy    . Right rotator cuff surgery    . Bladder tack    . Right hand surgery    . Appendectomy       FAMILY HISTORY: Family History  Problem Relation Age of Onset  . Heart attack Brother     Died of MI at age 72    SOCIAL HISTORY:  History   Social History  . Marital Status: Married    Spouse Name: Jasmine Smith    Number of Children: 3  . Years of Education: N/A   Occupational History  .    . sales    Social History Main Topics  . Smoking status: Never Smoker   . Smokeless tobacco: Never Used  . Alcohol Use: No  . Drug Use: No  . Sexual Activity: Not on file   Other Topics Concern  . Not on file   Social History Narrative   Patient is married Jasmine Smith).   Patient has three children.   High school education   Caffeine consumption is 2 cups daily              PHYSICAL EXAM   Filed Vitals:   10/06/13 1419  BP: 111/66  Pulse: 56  Height: 5\' 2"  (1.575 m)  Weight: 152 lb (68.947 kg)    Not recorded    Body mass index is 27.79 kg/(m^2).   Generalized: In no acute distress  Neck: Supple, no carotid bruits   Cardiac: Regular rate rhythm  Pulmonary: Clear to auscultation bilaterally  Musculoskeletal: No deformity  Neurological examination  Mentation: Alert oriented to time, place, history taking, and causual conversation  Cranial nerve II-XII: Pupils were equal round reactive to light. Extraocular movements were full.  Visual field were full on confrontational test. Bilateral fundi were sharp.  Facial sensation and strength were normal. Hearing was intact to finger rubbing bilaterally. Uvula tongue midline.  Head turning and shoulder shrug and were normal and symmetric.Tongue protrusion into cheek strength was normal.  Motor: Normal tone, bulk and strength.  Sensory: Intact to fine touch, pinprick, preserved vibratory sensation, and proprioception at toes.  Coordination: Normal finger to nose, heel-to-shin bilaterally there was no truncal ataxia  Gait: Rising up from seated position without assistance, normal stance, without trunk ataxia, moderate  stride, good arm swing, smooth turning, able to perform tiptoe, and heel walking without difficulty.   Romberg signs: Negative  Deep tendon reflexes: Brachioradialis 2/2, biceps 2/2, triceps 2/2, patellar 3/3, Achilles 2/2, plantar responses were flexor bilaterally.   DIAGNOSTIC DATA (LABS, IMAGING, TESTING) - I reviewed patient records, labs, notes, testing and imaging myself where available.  ASSESSMENT AND PLAN  KEAH LAMBA is a 78 y.o. female with past medical history of cervical spondylitic disease, mild to moderate canal stenosis at C5-6, C6 and 7, presenting with recurrent episode of sudden onset weakness, previously involving both  upper extremity, extensive evaluation failed to demonstrate etiology, now she had one episodes of numbness on the involving her right arm,  1, differentiation diagnosis including TIAs, vs. right radiculopathy, but is very unusual for radiculopathy without associated sensory changes.  2. Repeat MRI of neck, MRA of brain and neck 3. Return to clinic in one month  Marcial Pacas, M.D. Ph.D.  [facility] [address] [place], [place] [address] [phone]

## 2013-10-07 DIAGNOSIS — M542 Cervicalgia: Secondary | ICD-10-CM | POA: Insufficient documentation

## 2013-10-13 ENCOUNTER — Ambulatory Visit (INDEPENDENT_AMBULATORY_CARE_PROVIDER_SITE_OTHER): Payer: Medicare Other | Admitting: Neurology

## 2013-10-13 ENCOUNTER — Encounter (INDEPENDENT_AMBULATORY_CARE_PROVIDER_SITE_OTHER): Payer: Self-pay

## 2013-10-13 ENCOUNTER — Telehealth: Payer: Self-pay

## 2013-10-13 DIAGNOSIS — E785 Hyperlipidemia, unspecified: Secondary | ICD-10-CM

## 2013-10-13 DIAGNOSIS — R29898 Other symptoms and signs involving the musculoskeletal system: Secondary | ICD-10-CM

## 2013-10-13 DIAGNOSIS — Z0289 Encounter for other administrative examinations: Secondary | ICD-10-CM

## 2013-10-13 DIAGNOSIS — R531 Weakness: Secondary | ICD-10-CM

## 2013-10-13 NOTE — Telephone Encounter (Signed)
Patient is here having a ncv/emg and she has to have MRI and she wants to know and she wants to know if she can have xanax packet ?

## 2013-10-13 NOTE — Telephone Encounter (Signed)
Dr.Yan said ok to Xanax Dillard will give to her.

## 2013-10-13 NOTE — Procedures (Signed)
   NCS (NERVE CONDUCTION STUDY) WITH EMG (ELECTROMYOGRAPHY) REPORT   STUDY DATE: October 13 2013 PATIENT NAME: Jasmine Smith DOB: [birth year] MRN: [id number]    TECHNOLOGIST: Laretta Alstrom ELECTROMYOGRAPHER: Marcial Pacas M.D.  CLINICAL INFORMATION:   78 years old Caucasian female, presenting with recurrent episodes of sudden onset bilateral arm weakness, there was one episode, she only had transient right arm numbness, and weakness,  FINDINGS: NERVE CONDUCTION STUDY: Bilateral median, ulnar sensory and motor responses were normal  NEEDLE ELECTROMYOGRAPHY: Selected needle examination was performed at the right upper extremity muscles, and the right cervical paraspinal muscles  Needle examination of right first dorsal interossei, pronator teres, biceps, triceps, deltoid, extensor digitorum communis was normal  There was no spontaneous activity at right cervical paraspinal muscles, right C5, C6, C7.  IMPRESSION:  This is a normal study. There is no electrodiagnostic evidence of right upper extremity neuropathy, or right cervical radiculopathy   INTERPRETING PHYSICIAN:   Marcial Pacas M.D. Ph.D. [facility] [address] [place], [place] [address] [phone]

## 2013-10-19 ENCOUNTER — Ambulatory Visit
Admission: RE | Admit: 2013-10-19 | Discharge: 2013-10-19 | Disposition: A | Discharge status: Home / Self Care | Payer: Medicare Other | Source: Ambulatory Visit | Attending: Neurology | Admitting: Neurology

## 2013-10-19 DIAGNOSIS — R29898 Other symptoms and signs involving the musculoskeletal system: Secondary | ICD-10-CM

## 2013-10-19 MED ORDER — GADOBENATE DIMEGLUMINE 529 MG/ML IV SOLN
14.0000 mL | Freq: Once | INTRAVENOUS | Status: AC | PRN
  Administered 2013-10-19: 14 mL via INTRAVENOUS

## 2013-11-11 ENCOUNTER — Encounter: Payer: Medicare Other | Admitting: Neurology

## 2013-11-11 ENCOUNTER — Encounter (INDEPENDENT_AMBULATORY_CARE_PROVIDER_SITE_OTHER): Payer: Self-pay

## 2013-11-11 ENCOUNTER — Ambulatory Visit (INDEPENDENT_AMBULATORY_CARE_PROVIDER_SITE_OTHER): Payer: Medicare Other | Admitting: Neurology

## 2013-11-11 ENCOUNTER — Encounter: Payer: Self-pay | Admitting: Neurology

## 2013-11-11 VITALS — BP 133/63 | HR 50 | Ht 62.0 in | Wt 152.0 lb

## 2013-11-11 DIAGNOSIS — M542 Cervicalgia: Secondary | ICD-10-CM

## 2013-11-11 DIAGNOSIS — R531 Weakness: Secondary | ICD-10-CM

## 2013-11-11 DIAGNOSIS — R29898 Other symptoms and signs involving the musculoskeletal system: Secondary | ICD-10-CM

## 2013-11-11 DIAGNOSIS — R5383 Other fatigue: Secondary | ICD-10-CM

## 2013-11-11 DIAGNOSIS — R5381 Other malaise: Secondary | ICD-10-CM

## 2013-11-11 NOTE — Progress Notes (Signed)
PATIENT: Jasmine Smith DOB: 1936-02-25  HISTORICAL  Jasmine Smith is a 78 year old right-handed Caucasian female return for followup of acute onset right arm weakness,  I saw her in Oct 2012, she was referred by her primary care physician for evaluation of episodes of acute onset bilateral upper extremity weakness.  She has past medical history of hyperlipidemia, with frequent PVCs, in Summer of 2012, she had 3 episodes of sudden onset bilateral upper extremity weakness, to the point difficulty raising her arm overhead, lasting a few seconds there was no numbness, no loss of consciousness, she denies gait difficulty incontinence, in between episodes she was asymptomatic there was no associated chest pain heart palpitations  She had MRI of the brain without contrast at [facility] imaging has demonstrated mild atrophy, with moderate small vessel disease,  MRI of cervical multilevel spondylosis with posterior osteophytes contributing to moderate spinal stenosis at C5-C6 and C6-C7. No cord deformity is present.  Uncinate spurring and facet hypertrophy contribute to mild to moderate foraminal narrowing as described above, most advanced on the left at C5-C6.  Echocardiogram has demonstrated ejection fraction of 55%, mild aortic valve and mitral valve regurgitation.   Ultrasound of carotid artery was normal   She was evaluated by Dr. Stanford Breed in Nov 2012, stress ECHO showed   no definite scar or ischemia, etiology of her sudden onset bilateral arm, and generalized weakness remain unknown,   UPDATE March 12 th 2015:  She had been symptoms free from 2012-2015, she had one episode in Early March 2015, but this time only involving her right arm  She was in sleep, tried to turn, then realizied that right arm was dead, she could not picked up her right arm, no weakness on her left arm or legs, she started to using left arm to rub her right arm, her right arm could not feel her hand, she got up using  bathroom, everything went back to normal again, she denies chest pain, no neck pain, no gait difficulty, the symptom lasted less than one minute, then she is back to normal.   She denies gait difficulty, she does history of bladder sling, she has urinary urgency occasionally, mild constipation.   She has no visual loss.   UPDATE 17th 2015: She has one episode 2 weeks ago, in April 2nd, she woke up from sleep, her right arm went dead, she can not feel or move her right arm, when she hold of her right arm with her left hand, it comes backs to life again.  We have reviewed MRI cervical together,C5-6, C6-7: disc bulging and uncovertebral joint hypertrophy with mild spinal stenosis and severe biforaminal foraminal stenosis; no cord signal abnormalities. Multi-level spondylosis and disc bulging from C3-4 to C6-7. No change from MRI on 03/14/11  She denies gait difficulty, no significant neck pain, she denies upper extremity persistent numbness or weakness.  MRA of brain and neck were normal.   REVIEW OF SYSTEMS: Full 14 system review of systems performed and notable only for numbness,  ALLERGIES: Allergies  Allergen Reactions  . Clarithromycin   . Clarithromycin   . Levofloxacin     Poor Indigestion   . Levaquin  [Levofloxacin In D5w]   . Sulfa Antibiotics   . Sulfa Drugs Cross Reactors     HOME MEDICATIONS: Current Outpatient Prescriptions on File Prior to Visit  Medication Sig Dispense Refill  . amLODipine (NORVASC) 5 MG tablet       . aspirin 81 MG tablet  Take 81 mg by mouth 2 (two) times daily.       . Biotin 5000 MCG CAPS Take 1 capsule by mouth.        . Coenzyme Q10 (COQ10) 100 MG CAPS Take 1 capsule by mouth daily.        Marland Kitchen KRILL OIL PO Take 1 capsule by mouth daily.        Marland Kitchen LORazepam (ATIVAN) 0.5 MG tablet       . losartan (COZAAR) 100 MG tablet 25 mg.       . metoprolol succinate (TOPROL XL) 25 MG 24 hr tablet Take 1 tablet (25 mg total) by mouth daily.  30 tablet  11  .  Multiple Vitamin (MULTIVITAMIN) tablet Take 1 tablet by mouth daily.        . simvastatin (ZOCOR) 20 MG tablet Take 20 mg by mouth 2 (two) times daily.         No current facility-administered medications on file prior to visit.    PAST MEDICAL HISTORY: Past Medical History  Diagnosis Date  . Hyperlipidemia   . PVC (premature ventricular contraction)   . Esophageal reflux   . CVA (cerebral infarction)     PAST SURGICAL HISTORY: Past Surgical History  Procedure Laterality Date  . Lung removal, partial      RML  . Cholecystectomy    . Abdominal hysterectomy    . Right rotator cuff surgery    . Bladder tack    . Right hand surgery    . Appendectomy      FAMILY HISTORY: Family History  Problem Relation Age of Onset  . Heart attack Brother     Died of MI at age 75    SOCIAL HISTORY:  History   Social History  . Marital Status: Married    Spouse Name: Milus Banister    Number of Children: 3  . Years of Education: N/A   Occupational History  .    . sales    Social History Main Topics  . Smoking status: Never Smoker   . Smokeless tobacco: Never Used  . Alcohol Use: No  . Drug Use: No  . Sexual Activity: Not on file   Other Topics Concern  . Not on file   Social History Narrative   Patient is married Milus Banister).   Patient has three children.   High school education   Caffeine consumption is 2 cups daily              PHYSICAL EXAM   Filed Vitals:   11/11/13 0857  BP: 133/63  Pulse: 50  Height: 5\' 2"  (1.575 m)  Weight: 152 lb (68.947 kg)    Not recorded    Body mass index is 27.79 kg/(m^2).   Generalized: In no acute distress  Neck: Supple, no carotid bruits   Cardiac: Regular rate rhythm  Pulmonary: Clear to auscultation bilaterally  Musculoskeletal: No deformity  Neurological examination  Mentation: Alert oriented to time, place, history taking, and causual conversation  Cranial nerve II-XII: Pupils were equal round reactive to light.  Extraocular movements were full.  Visual field were full on confrontational test. Bilateral fundi were sharp.  Facial sensation and strength were normal. Hearing was intact to finger rubbing bilaterally. Uvula tongue midline.  Head turning and shoulder shrug and were normal and symmetric.Tongue protrusion into cheek strength was normal.  Motor: Normal tone, bulk and strength.  Sensory: Intact to fine touch, pinprick, preserved vibratory sensation, and proprioception at toes.  Coordination: Normal finger to nose, heel-to-shin bilaterally there was no truncal ataxia  Gait: Rising up from seated position without assistance, normal stance, without trunk ataxia, moderate stride, good arm swing, smooth turning, able to perform tiptoe, and heel walking without difficulty.   Romberg signs: Negative  Deep tendon reflexes: Brachioradialis 2/2, biceps 2/2, triceps 2/2, patellar 3/3, Achilles 2/2, plantar responses were flexor bilaterally.   DIAGNOSTIC DATA (LABS, IMAGING, TESTING) - I reviewed patient records, labs, notes, testing and imaging myself where available.  ASSESSMENT AND PLAN  DYNVER CLEMSON is a 78 y.o. female with past medical history of cervical spondylitic disease, mild to moderate canal stenosis at C5-6, C6 and 7, presenting with recurrent episode of sudden onset weakness, previously involving both upper extremity, extensive evaluation failed to demonstrate etiology, now she had recurrent episodes of numbness and weakness, transient, only involving her right arm, MRI of the cervical spine reviewed together with patient, there is multilevel degenerative disc disease, most severe ,C5-6, C6-7: disc bulging and uncovertebral joint hypertrophy with mild spinal stenosis and severe biforaminal foraminal stenosis; no cord signal abnormalities. Multi-level spondylosis and disc bulging from C3-4 to C6-7. No change from MRI on 03/14/11  I have discussed this patient, symptoms that could be potentially  explained by position induced multi-level right cervical radiculopathies. She is to continue observe for symptoms, call office for more frequent more severe symptoms, otherwise return to clinic in one year.   Jasmine Smith, M.D. Ph.D.  [facility] [address] [place], [place] [address] [phone]

## 2014-07-13 ENCOUNTER — Other Ambulatory Visit: Payer: Self-pay | Admitting: Family Medicine

## 2014-07-13 ENCOUNTER — Ambulatory Visit
Admission: RE | Admit: 2014-07-13 | Discharge: 2014-07-13 | Disposition: A | Discharge status: Home / Self Care | Payer: Medicare Other | Source: Ambulatory Visit | Attending: Family Medicine | Admitting: Family Medicine

## 2014-07-13 DIAGNOSIS — R0781 Pleurodynia: Secondary | ICD-10-CM

## 2014-07-28 HISTORY — PX: SHOULDER SURGERY: SHX246

## 2014-10-16 ENCOUNTER — Other Ambulatory Visit: Payer: Self-pay

## 2014-10-16 DIAGNOSIS — Z1231 Encounter for screening mammogram for malignant neoplasm of breast: Secondary | ICD-10-CM

## 2014-10-24 ENCOUNTER — Ambulatory Visit
Admission: RE | Admit: 2014-10-24 | Discharge: 2014-10-24 | Disposition: A | Discharge status: Home / Self Care | Payer: Medicare Other | Source: Ambulatory Visit

## 2014-10-24 DIAGNOSIS — Z1231 Encounter for screening mammogram for malignant neoplasm of breast: Secondary | ICD-10-CM

## 2014-11-16 ENCOUNTER — Ambulatory Visit: Payer: Medicare Other | Admitting: Neurology

## 2015-02-13 ENCOUNTER — Encounter: Payer: Self-pay | Admitting: Gastroenterology

## 2015-03-14 ENCOUNTER — Ambulatory Visit
Admission: RE | Admit: 2015-03-14 | Discharge: 2015-03-14 | Disposition: A | Discharge status: Home / Self Care | Payer: Medicare Other | Source: Ambulatory Visit | Attending: Neurology | Admitting: Neurology

## 2015-03-14 ENCOUNTER — Ambulatory Visit (INDEPENDENT_AMBULATORY_CARE_PROVIDER_SITE_OTHER): Payer: Medicare Other | Admitting: Neurology

## 2015-03-14 ENCOUNTER — Encounter: Payer: Self-pay | Admitting: Neurology

## 2015-03-14 ENCOUNTER — Telehealth: Payer: Self-pay | Admitting: Neurology

## 2015-03-14 VITALS — BP 135/63 | HR 58 | Ht 62.0 in | Wt 150.0 lb

## 2015-03-14 DIAGNOSIS — M546 Pain in thoracic spine: Secondary | ICD-10-CM

## 2015-03-14 DIAGNOSIS — M549 Dorsalgia, unspecified: Secondary | ICD-10-CM

## 2015-03-14 DIAGNOSIS — M542 Cervicalgia: Secondary | ICD-10-CM | POA: Diagnosis not present

## 2015-03-14 MED ORDER — CELECOXIB 100 MG PO CAPS: 100.0000 mg | ORAL_CAPSULE | Freq: Two times a day (BID) | ORAL | Status: DC | PRN

## 2015-03-14 NOTE — Telephone Encounter (Signed)
Jasmine Smith, please call patient, x-ray of thoracic spine showed mild degenerative changes, no acute lesions

## 2015-03-14 NOTE — Progress Notes (Signed)
Chief Complaint  Patient presents with  . Back Pain    She is concerned about some thoracic pain she has been experiencing.  She has not had any further episodes of weakness or numbness in her arms.      PATIENT: Jasmine Smith DOB: 1936/03/20  HISTORICAL  Jasmine Smith is a 79 year old right-handed Caucasian female return for followup of acute onset right arm weakness,  I saw her in Oct 2012, she was referred by her primary care physician for evaluation of episodes of acute onset bilateral upper extremity weakness.  She has past medical history of hyperlipidemia, with frequent PVCs, in Summer of 2012, she had 3 episodes of sudden onset bilateral upper extremity weakness, to the point difficulty raising her arm overhead, lasting a few seconds there was no numbness, no loss of consciousness, she denies gait difficulty incontinence, in between episodes she was asymptomatic there was no associated chest pain heart palpitations  She had MRI of the brain without contrast at [facility] imaging has demonstrated mild atrophy, with moderate small vessel disease,  MRI of cervical multilevel spondylosis with posterior osteophytes contributing to moderate spinal stenosis at C5-C6 and C6-C7. No cord deformity is present.  Uncinate spurring and facet hypertrophy contribute to mild to moderate foraminal narrowing as described above, most advanced on the left at C5-C6.  Echocardiogram has demonstrated ejection fraction of 55%, mild aortic valve and mitral valve regurgitation.   Ultrasound of carotid artery was normal   She was evaluated by Dr. Stanford Breed in Nov 2012, stress ECHO showed   no definite scar or ischemia, etiology of her sudden onset bilateral arm, and generalized weakness remain unknown,   UPDATE March 12 th 2015:  She had been symptoms free from 2012-2015, she had one episode in Early March 2015, but this time only involving her right arm  She was in sleep, tried to turn, then realizied that  right arm was dead, she could not picked up her right arm, no weakness on her left arm or legs, she started to using left arm to rub her right arm, her right arm could not feel her hand, she got up using bathroom, everything went back to normal again, she denies chest pain, no neck pain, no gait difficulty, the symptom lasted less than one minute, then she is back to normal.   She denies gait difficulty, she does history of bladder sling, she has urinary urgency occasionally, mild constipation.   She has no visual loss.   UPDATE April 17th 2015: She has one episode 2 weeks ago, in April 2nd, she woke up from sleep, her right arm went dead, she can not feel or move her right arm, when she hold of her right arm with her left hand, it comes backs to life again.  We have reviewed MRI cervical together,C5-6, C6-7: disc bulging and uncovertebral joint hypertrophy with mild spinal stenosis and severe biforaminal foraminal stenosis; no cord signal abnormalities. Multi-level spondylosis and disc bulging from C3-4 to C6-7. No change from MRI on 03/14/11  She denies gait difficulty, no significant neck pain, she denies upper extremity persistent numbness or weakness.  MRA of brain and neck were normal.   UPDATE March 14 2015: Last visit was April 2015, she has no recurrent right arm weakness, had left rotator cuff surgery January 2016, no longer has significant pain, but has limited range of motion even after physical therapy.  She is now complaining 1 month history of intermittent between shoulder blade area  deep achy pain, this started since July 2016, mild improvement after heating pad, ibuprofen was helpful, but irritate her stomach, she denies gait difficulty, no bowel and bladder incontinence,  We have reviewed MRI of cervical spine in March 2015, Her husband had pacemaker, left rotator cuff in Jan 2016, it took 6 months, she has limited range of motion, no significant pain, no recurrent right arm  numbness.   MRI cevical, reviewed in March 2015, multilevel cervical degenerative disc disease, at C5-6, C6-7, with disc bulging, joint hypertrophy, with mild canal stenosis, severe bilateral foraminal stenosis, no cord signal abnormality.  REVIEW OF SYSTEMS: Full 14 system review of systems performed and notable only for back pain  ALLERGIES: Allergies  Allergen Reactions  . Clarithromycin   . Clarithromycin   . Levofloxacin     Poor Indigestion   . Levaquin  [Levofloxacin In D5w]   . Sulfa Antibiotics   . Sulfa Drugs Cross Reactors     HOME MEDICATIONS: Current Outpatient Prescriptions on File Prior to Visit  Medication Sig Dispense Refill  . amLODipine (NORVASC) 5 MG tablet     . aspirin 81 MG tablet Take 81 mg by mouth 2 (two) times daily.     . Biotin 5000 MCG CAPS Take 1 capsule by mouth.      Marland Kitchen KRILL OIL PO Take 1 capsule by mouth daily.      Marland Kitchen LORazepam (ATIVAN) 0.5 MG tablet     . losartan (COZAAR) 100 MG tablet 25 mg.     . Multiple Vitamin (MULTIVITAMIN) tablet Take 1 tablet by mouth daily.      . simvastatin (ZOCOR) 20 MG tablet Take 20 mg by mouth 2 (two) times daily.       No current facility-administered medications on file prior to visit.    PAST MEDICAL HISTORY: Past Medical History  Diagnosis Date  . Hyperlipidemia   . PVC (premature ventricular contraction)   . Esophageal reflux   . CVA (cerebral infarction)     PAST SURGICAL HISTORY: Past Surgical History  Procedure Laterality Date  . Lung removal, partial      RML  . Cholecystectomy    . Abdominal hysterectomy    . Right rotator cuff surgery    . Bladder tack    . Right hand surgery    . Appendectomy    . Shoulder surgery Left     07/2014    FAMILY HISTORY: Family History  Problem Relation Age of Onset  . Heart attack Brother     Died of MI at age 22    SOCIAL HISTORY:  Social History   Social History  . Marital Status: Married    Spouse Name: Jasmine Smith  . Number of Children: 3    . Years of Education: N/A   Occupational History  .    . sales    Social History Main Topics  . Smoking status: Never Smoker   . Smokeless tobacco: Never Used  . Alcohol Use: No  . Drug Use: No  . Sexual Activity: Not on file   Other Topics Concern  . Not on file   Social History Narrative   Patient is married Jasmine Smith).   Patient has three children.   High school education   Caffeine consumption is 2 cups daily              PHYSICAL EXAM   Filed Vitals:   03/14/15 1327  BP: 135/63  Pulse: 58  Height: 5\' 2"  (1.575  m)  Weight: 150 lb (68.04 kg)    Not recorded      Body mass index is 27.43 kg/(m^2).   Generalized: In no acute distress  Neck: Supple, no carotid bruits   Cardiac: Regular rate rhythm  Pulmonary: Clear to auscultation bilaterally  Musculoskeletal: No deformity, tenderness of T7-T8 upon palpitation, mild bilateral thoracic paraspinal muscle spasm  Neurological examination  Mentation: Alert oriented to time, place, history taking, and causual conversation  Cranial nerve II-XII: Pupils were equal round reactive to light. Extraocular movements were full.  Visual field were full on confrontational test. Bilateral fundi were sharp.  Facial sensation and strength were normal. Hearing was intact to finger rubbing bilaterally. Uvula tongue midline.  Head turning and shoulder shrug and were normal and symmetric.Tongue protrusion into cheek strength was normal.  Motor: Normal tone, bulk and strength.  Sensory: Intact to fine touch, pinprick, preserved vibratory sensation, and proprioception at toes.  Coordination: Normal finger to nose, heel-to-shin bilaterally there was no truncal ataxia  Gait: Rising up from seated position without assistance, normal stance, without trunk ataxia, moderate stride, good arm swing, smooth turning, able to perform tiptoe, and heel walking without difficulty.   Romberg signs: Negative  Deep tendon reflexes:  Brachioradialis 2/2, biceps 2/2, triceps 2/2, patellar 3/3, Achilles 2/2, plantar responses were flexor bilaterally.   DIAGNOSTIC DATA (LABS, IMAGING, TESTING) - I reviewed patient records, labs, notes, testing and imaging myself where available.  ASSESSMENT AND PLAN  Jasmine Smith is a 79 y.o. female  Cervical spondylitic disease Upper thoracic pain,  Tenderness upon palpitation, differentiation diagnosis includes musculoskeletal etiology,  X-ray of thoracic spine, I will call her report  Celebrex 100 mg as needed  Return to clinic in 3 months with nurse practitioner  Marcial Pacas, M.D. Ph.D.  [facility] [address] [place],  [address] [phone]

## 2015-03-15 NOTE — Telephone Encounter (Signed)
Spoke to patient she is aware of results

## 2015-03-19 ENCOUNTER — Telehealth: Payer: Self-pay | Admitting: Nurse Practitioner

## 2015-03-19 NOTE — Telephone Encounter (Signed)
[phone], [facility] and states that medication  celecoxib (CELEBREX) 100 MG capsule has been approved on 8/20 for 1 yr. Was told an approval letter will be sent. Thank you

## 2015-03-19 NOTE — Telephone Encounter (Signed)
[employer] has approved the request for coverage on Celebrex effective until 03/16/2016 Ref # [id number]

## 2015-04-17 ENCOUNTER — Encounter: Payer: Self-pay | Admitting: Gastroenterology

## 2015-04-17 ENCOUNTER — Ambulatory Visit (INDEPENDENT_AMBULATORY_CARE_PROVIDER_SITE_OTHER): Payer: Medicare Other | Admitting: Gastroenterology

## 2015-04-17 VITALS — BP 104/54 | HR 56 | Ht 62.0 in | Wt 148.5 lb

## 2015-04-17 DIAGNOSIS — R109 Unspecified abdominal pain: Secondary | ICD-10-CM

## 2015-04-17 DIAGNOSIS — R1012 Left upper quadrant pain: Secondary | ICD-10-CM | POA: Diagnosis not present

## 2015-04-17 DIAGNOSIS — R140 Abdominal distension (gaseous): Secondary | ICD-10-CM

## 2015-04-17 DIAGNOSIS — K5900 Constipation, unspecified: Secondary | ICD-10-CM | POA: Diagnosis not present

## 2015-04-17 DIAGNOSIS — K5730 Diverticulosis of large intestine without perforation or abscess without bleeding: Secondary | ICD-10-CM

## 2015-04-17 DIAGNOSIS — G8929 Other chronic pain: Secondary | ICD-10-CM

## 2015-04-17 MED ORDER — NA SULFATE-K SULFATE-MG SULF 17.5-3.13-1.6 GM/177ML PO SOLN: 1.0000 | Freq: Once | ORAL | Status: DC

## 2015-04-17 NOTE — Patient Instructions (Addendum)
Increase your Miralax to daily and increase the intake of prunes to every day.   You have been scheduled for a colonoscopy. Please follow written instructions given to you at your visit today.  Please pick up your prep supplies at the pharmacy within the next 1-3 days. If you use inhalers (even only as needed), please bring them with you on the day of your procedure. Your physician has requested that you go to www.startemmi.com and enter the access code given to you at your visit today. This web site gives a general overview about your procedure. However, you should still follow specific instructions given to you by our office regarding your preparation for the procedure.  Thank you for choosing me and [facility].  Pricilla Riffle. Dagoberto Ligas., MD., Marval Regal  cc: Shirline Frees, MD

## 2015-04-17 NOTE — Progress Notes (Signed)
    History of Present Illness: This is a 79 year old female referred by Shirline Frees, MD for the evaluation of left flank pain, back pain, bloating, constipation. She has had problems with constipation and bloating for many years for her symptoms have gradually worsened over the past 2-3 years. She underwent abdominal/pelvic CT in July 2013 which showed extensive diverticulosis and mild diverticulitis. She previously underwent colonoscopy by Dr. Lyla Son in 2001 showing moderately severe pandiverticulosis and in 2007 showing a small hyperplastic polyp and diverticulosis. She underwent upper endoscopy by Dr. Lyla Son 2001 showed mild gastritis, small HH and a small gastric polyp. Denies weight loss, diarrhea, change in stool caliber, melena, hematochezia, nausea, vomiting, dysphagia, reflux symptoms, chest pain.  Review of Systems: Pertinent positive and negative review of systems were noted in the above HPI section. All other review of systems were otherwise negative.  Current Medications, Allergies, Past Medical History, Past Surgical History, Family History and Social History were reviewed in [employer] record.  Physical Exam: General: Well developed, well nourished, no acute distress Head: Normocephalic and atraumatic Eyes:  sclerae anicteric, EOMI Ears: Normal auditory acuity Mouth: No deformity or lesions Neck: Supple, no masses or thyromegaly Lungs: Clear throughout to auscultation Heart: Regular rate and rhythm; no murmurs, rubs or bruits Abdomen: Soft, non tender and non distended. No masses, hepatosplenomegaly or hernias noted. Normal Bowel sounds Rectal: deferred to colonoscopy Musculoskeletal: Symmetrical with no gross deformities  Skin: No lesions on visible extremities Pulses:  Normal pulses noted Extremities: No clubbing, cyanosis, edema or deformities noted Neurological: Alert oriented x 4, grossly nonfocal Cervical Nodes:  No significant  cervical adenopathy Inguinal Nodes: No significant inguinal adenopathy Psychological:  Alert and cooperative. Normal mood and affect  Assessment and Recommendations:  1. Left flank pain, back pain, constipation and bloating. Symptoms may not be all GI related. Intensify management for constipation by taking MiraLAX every day instead of every other day along with an ongoing high fiber diet with adequate fluid intake. Schedule colonoscopy. The risks (including bleeding, perforation, infection, missed lesions, medication reactions and possible hospitalization or surgery if complications occur), benefits, and alternatives to colonoscopy with possible biopsy and possible polypectomy were discussed with the patient and they consent to proceed. Consider abdominal/pelvic CT if colonoscopy not diagnostic and symptoms persist despite improved management of constipation.   cc: Shirline Frees, MD [address] [place], [place] [address]

## 2015-05-29 DIAGNOSIS — D126 Benign neoplasm of colon, unspecified: Secondary | ICD-10-CM

## 2015-05-29 HISTORY — DX: Benign neoplasm of colon, unspecified: D12.6

## 2015-06-08 ENCOUNTER — Encounter: Payer: Self-pay | Admitting: Gastroenterology

## 2015-06-14 ENCOUNTER — Ambulatory Visit (INDEPENDENT_AMBULATORY_CARE_PROVIDER_SITE_OTHER): Payer: Medicare Other | Admitting: Nurse Practitioner

## 2015-06-14 ENCOUNTER — Encounter: Payer: Self-pay | Admitting: Nurse Practitioner

## 2015-06-14 VITALS — BP 134/66 | HR 60 | Ht 62.5 in | Wt 149.2 lb

## 2015-06-14 DIAGNOSIS — R29898 Other symptoms and signs involving the musculoskeletal system: Secondary | ICD-10-CM

## 2015-06-14 DIAGNOSIS — M542 Cervicalgia: Secondary | ICD-10-CM

## 2015-06-14 NOTE — Patient Instructions (Signed)
Continue Current meds F/U  Yearly and prn

## 2015-06-14 NOTE — Progress Notes (Signed)
[facility]  PATIENT: Jasmine Smith DOB: 1935-08-10   REASON FOR VISIT: Follow-up for cervical and thoracic pain HISTORY FROM: Patient    HISTORY OF PRESENT ILLNESS: HISTORY YYJoyce KATILIN Smith is a 79 year old right-handed Caucasian female return for followup of acute onset right arm weakness, I saw her in Oct 2012, she was referred by her primary care physician for evaluation of episodes of acute onset bilateral upper extremity weakness. She has past medical history of hyperlipidemia, with frequent PVCs, in Summer of 2012, she had 3 episodes of sudden onset bilateral upper extremity weakness, to the point difficulty raising her arm overhead, lasting a few seconds there was no numbness, no loss of consciousness, she denies gait difficulty incontinence, in between episodes she was asymptomatic there was no associated chest pain heart palpitations She had MRI of the brain without contrast at [facility] imaging has demonstrated mild atrophy, with moderate small vessel disease, MRI of cervical multilevel spondylosis with posterior osteophytes contributing to moderate spinal stenosis at C5-C6 and C6-C7. No cord deformity is present. Uncinate spurring and facet hypertrophy contribute to mild to moderate foraminal narrowing as described above, most advanced on the left at C5-C6. Echocardiogram has demonstrated ejection fraction of 55%, mild aortic valve and mitral valve regurgitation.  Ultrasound of carotid artery was normal She was evaluated by Dr. Stanford Breed in Nov 2012, stress ECHO showed no definite scar or ischemia, etiology of her sudden onset bilateral arm, and generalized weakness remain unknown,   UPDATE March 12 th 2015:  She had been symptoms free from 2012-2015, she had one episode in Early March 2015, but this time only involving her right arm She was in sleep, tried to turn, then realizied that right arm was dead, she could not picked up her right arm, no weakness on  her left arm or legs, she started to using left arm to rub her right arm, her right arm could not feel her hand, she got up using bathroom, everything went back to normal again, she denies chest pain, no neck pain, no gait difficulty, the symptom lasted less than one minute, then she is back to normal.  She denies gait difficulty, she does history of bladder sling, she has urinary urgency occasionally, mild constipation. She has no visual loss.   UPDATE April 17th 2015: She has one episode 2 weeks ago, in April 2nd, she woke up from sleep, her right arm went dead, she can not feel or move her right arm, when she hold of her right arm with her left hand, it comes backs to life again. We have reviewed MRI cervical together,C5-6, C6-7: disc bulging and uncovertebral joint hypertrophy with mild spinal stenosis and severe biforaminal foraminal stenosis; no cord signal abnormalities. Multi-level spondylosis and disc bulging from C3-4 to C6-7. No change from MRI on 03/14/11 She denies gait difficulty, no significant neck pain, she denies upper extremity persistent numbness or weakness.MRA of brain and neck were normal.   UPDATE March 14 2015: Last visit was April 2015, she has no recurrent right arm weakness, had left rotator cuff surgery January 2016, no longer has significant pain, but has limited range of motion even after physical therapy.She is now complaining 1 month history of intermittent between shoulder blade area deep achy pain, this started since July 2016, mild improvement after heating pad, ibuprofen was helpful, but irritate her stomach, she denies gait difficulty, no bowel and bladder incontinence, We have reviewed MRI of cervical spine in March 2015, Her husband  had pacemaker, left rotator cuff in Jan 2016, it took 6 months, she has limited range of motion, no significant pain, no recurrent right arm numbness.  MRI cevical, reviewed in March 2015, multilevel cervical degenerative disc  disease, at C5-6, C6-7, with disc bulging, joint hypertrophy, with mild canal stenosis, severe bilateral foraminal stenosis, no cord signal abnormality.  UPDATE 06/14/2015 Ms. Mcweeney, 79 year old female returns for follow-up she has a history of intermittent between shoulder blade area deep ache deep. She has been previously seen in the office by Dr. Krista Blue. Last visit was 03/14/15. MRI of the thoracic spine shows mild degenerative disc disease. She was placed on Celebrex however it upset her stomach. She also has history of degenerative disc disease of the cervical spine. She denies any bowel or bladder incontinence. She denies any falls. She does not use an assistive device. She denies any upper extremity weakness or numbness. She returns for reevaluation  REVIEW OF SYSTEMS: Full 14 system review of systems performed and notable only for those listed, all others are neg:  Constitutional: neg  Cardiovascular: Leg swelling Ear/Nose/Throat: neg  Skin: neg Eyes: neg Respiratory: neg Gastroitestinal: Constipation Hematology/Lymphatic: neg  Endocrine: neg Musculoskeletal: Back pain  Allergy/Immunology: neg Neurological: neg Psychiatric: neg Sleep : neg   ALLERGIES: Allergies  Allergen Reactions  . Clarithromycin   . Clarithromycin   . Levofloxacin     Poor Indigestion   . Levaquin  [Levofloxacin In D5w]   . Sulfa Antibiotics   . Sulfa Drugs Cross Reactors     HOME MEDICATIONS: Outpatient Prescriptions Prior to Visit  Medication Sig Dispense Refill  . amLODipine (NORVASC) 5 MG tablet     . aspirin 81 MG tablet Take 162 mg by mouth daily.     . Biotin 5000 MCG CAPS Take 1 capsule by mouth.      . cetirizine (ZYRTEC) 10 MG tablet Take 10 mg by mouth daily.    Marland Kitchen KRILL OIL PO Take 1 capsule by mouth daily.      Marland Kitchen LORazepam (ATIVAN) 0.5 MG tablet Take 0.5 mg by mouth 2 (two) times daily.     Marland Kitchen losartan (COZAAR) 100 MG tablet 25 mg.     . metoprolol succinate (TOPROL-XL) 50 MG 24 hr tablet  Take 50 mg by mouth daily.  0  . Multiple Vitamin (MULTIVITAMIN) tablet Take 1 tablet by mouth daily.      . Na Sulfate-K Sulfate-Mg Sulf SOLN Take 1 kit by mouth once. 354 mL 0  . simvastatin (ZOCOR) 20 MG tablet Take 20 mg by mouth daily.      No facility-administered medications prior to visit.    PAST MEDICAL HISTORY: Past Medical History  Diagnosis Date  . Hyperlipidemia   . PVC (premature ventricular contraction)   . Esophageal reflux   . CVA (cerebral infarction)   . Hypertension   . Adenomatous colon polyp   . Diverticulosis   . GERD (gastroesophageal reflux disease)   . Anxiety     PAST SURGICAL HISTORY: Past Surgical History  Procedure Laterality Date  . Lung removal, partial      RML  . Cholecystectomy  1986  . Abdominal hysterectomy  2002    with bladder tact  . Right rotator cuff surgery Right 2009  . Bladder tack and sling  2002  . Ganglion cyst excision Right     wrist  . Appendectomy    . Shoulder surgery Left 07/2014  . Breast cyst aspiration Right   . Breast  cyst excision Left     x 2    FAMILY HISTORY: Family History  Problem Relation Age of Onset  . Heart attack Brother     Died of MI at age 28  . Prostate cancer Brother     x 2  . Colon polyps Sister   . Diabetes Brother   . Diabetes Mother   . Diabetes Maternal Aunt     x 5  . Stomach cancer Paternal Grandfather   . Stomach cancer Paternal Grandmother   . Heart failure Father   . Lung cancer Father     SOCIAL HISTORY: Social History   Social History  . Marital Status: Married    Spouse Name: Milus Banister  . Number of Children: 3  . Years of Education: N/A   Occupational History  . retired   . sales    Social History Main Topics  . Smoking status: Never Smoker   . Smokeless tobacco: Never Used  . Alcohol Use: No  . Drug Use: No  . Sexual Activity: Not on file   Other Topics Concern  . Not on file   Social History Narrative   Patient is married Milus Banister).   Patient has  three children.   High school education   Caffeine consumption is 2 cups daily              PHYSICAL EXAM  Filed Vitals:   06/14/15 1055  BP: 134/66  Pulse: 60  Height: 5' 2.5" (1.588 m)  Weight: 149 lb 3.2 oz (67.677 kg)   Body mass index is 26.84 kg/(m^2). Generalized: In no acute distress Neck: Supple, no carotid bruits  Cardiac: Regular rate rhythm Musculoskeletal: No deformity, tenderness of T7-T8 upon palpitation Neurological examination  Mentation: Alert oriented to time, place, history taking, and causual conversation Cranial nerve II-XII: Pupils were equal round reactive to light. Extraocular movements were full. Visual field were full on confrontational test.  Facial sensation and strength were normal. Hearing was intact to finger rubbing bilaterally. Uvula tongue midline. Head turning and shoulder shrug and were normal and symmetric.Tongue protrusion into cheek strength was normal. Motor: Normal tone, bulk and strength. No focal weakness Sensory: Intact to fine touch, pinprick, preserved vibratory sensation, and proprioception in upper and lower extremities Coordination: Normal finger to nose, heel-to-shin bilaterally there was no truncal ataxia Gait: Rising up from seated position without assistance, normal stance, without trunk ataxia, moderate stride, good arm swing, smooth turning, able to perform tiptoe, and heel walking without difficulty.  Romberg signs: Negative Deep tendon reflexes: Brachioradialis 2/2, biceps 2/2, triceps 2/2, patellar 3/3, Achilles 2/2, plantar responses were flexor bilaterally.  DIAGNOSTIC DATA (LABS, IMAGING, TESTING)  ASSESSMENT AND PLAN  79 y.o. year old female  has a past medical history of cervical spondylitic disease, and degenerative disease of the thoracic area. She is here for follow-up. Reviewed previous medical records  Continue Current meds Try Tylenol when necessary for pain Continue with heat to the area Balance   activities with rest periods F/U  Yearly and prn Dennie Bible, [facility], [facility], APRN  [facility] [address], [place], [place] [address] [phone]

## 2015-06-15 ENCOUNTER — Telehealth: Payer: Self-pay | Admitting: Gastroenterology

## 2015-06-15 NOTE — Telephone Encounter (Signed)
Patient informed she can pick up free sample of Suprep out our office. Patient states she will come by today to pick it up.

## 2015-06-19 ENCOUNTER — Other Ambulatory Visit: Payer: Self-pay

## 2015-06-19 ENCOUNTER — Encounter: Payer: Self-pay | Admitting: Gastroenterology

## 2015-06-19 ENCOUNTER — Ambulatory Visit (AMBULATORY_SURGERY_CENTER): Payer: Medicare Other | Admitting: Gastroenterology

## 2015-06-19 ENCOUNTER — Other Ambulatory Visit (INDEPENDENT_AMBULATORY_CARE_PROVIDER_SITE_OTHER): Payer: Medicare Other

## 2015-06-19 VITALS — BP 128/61 | HR 51 | Temp 96.1°F | Resp 32 | Ht 62.0 in | Wt 148.0 lb

## 2015-06-19 DIAGNOSIS — K5900 Constipation, unspecified: Secondary | ICD-10-CM

## 2015-06-19 DIAGNOSIS — R109 Unspecified abdominal pain: Secondary | ICD-10-CM

## 2015-06-19 DIAGNOSIS — K5730 Diverticulosis of large intestine without perforation or abscess without bleeding: Secondary | ICD-10-CM

## 2015-06-19 DIAGNOSIS — D124 Benign neoplasm of descending colon: Secondary | ICD-10-CM

## 2015-06-19 DIAGNOSIS — K5909 Other constipation: Secondary | ICD-10-CM

## 2015-06-19 LAB — CREATININE, SERUM: Creatinine, Ser: 0.71 mg/dL (ref 0.40–1.20)

## 2015-06-19 LAB — BUN: BUN: 12 mg/dL (ref 6–23)

## 2015-06-19 MED ORDER — SODIUM CHLORIDE 0.9 % IV SOLN: 500.0000 mL | INTRAVENOUS | Status: DC

## 2015-06-19 NOTE — Progress Notes (Signed)
Called to room to assist during endoscopic procedure.  Patient ID and intended procedure confirmed with present staff. Received instructions for my participation in the procedure from the performing physician.  

## 2015-06-19 NOTE — Progress Notes (Signed)
Report to PACU, RN, vss, BBS= Clear.  

## 2015-06-19 NOTE — Op Note (Signed)
[facility]  [address] [place], [address]   COLONOSCOPY PROCEDURE REPORT  PATIENT: Jasmine Smith, Jasmine Smith  MR#: [id number] BIRTHDATE: [birth year] , 79  yrs. old GENDER: female ENDOSCOPIST: Ladene Artist, MD, [facility] REFERRED BY: Azalia Bilis, M.D. PROCEDURE DATE:  06/19/2015 PROCEDURE:   Colonoscopy, diagnostic and Colonoscopy with snare polypectomy First Screening Colonoscopy - Avg.  risk and is 50 yrs.  old or older - No.  Prior Negative Screening - Now for repeat screening. N/A  History of Adenoma - Now for follow-up colonoscopy & has been > or = to 3 yrs.  N/A  Polyps removed today? Yes ASA CLASS:   Class II INDICATIONS:Constipation, left flank pain. MEDICATIONS: Monitored anesthesia care and Propofol 120 mg IV DESCRIPTION OF PROCEDURE:   After the risks benefits and alternatives of the procedure were thoroughly explained, informed consent was obtained.  The digital rectal exam revealed no abnormalities of the rectum.   The LB [id number]  endoscope was introduced through the anus and advanced to the cecum, which was identified by both the appendix and ileocecal valve. No adverse events experienced.   The quality of the prep was excellent. (Suprep was used)  The instrument was then slowly withdrawn as the colon was fully examined. Estimated blood loss is zero unless otherwise noted in this procedure report.    COLON FINDINGS: There was moderate diverticulosis noted in the sigmoid colon and descending colon with associated colonic spasm, luminal narrowing and muscular hypertrophy.   A sessile polyp measuring 6 mm in size was found in the descending colon.  A polypectomy was performed with a cold snare.  The resection was complete, the polyp tissue was completely retrieved and sent to histology.   The examination was otherwise normal.  Retroflexed views revealed no abnormalities. The time to cecum = 2.2 Withdrawal time = 7.8   The scope was withdrawn and  the procedure completed. COMPLICATIONS: There were no immediate complications.  ENDOSCOPIC IMPRESSION: 1.   Moderate diverticulosis in the sigmoid colon and descending colon 2.   Sessile polyp in the descending colon; polypectomy performed with a cold snare  RECOMMENDATIONS: 1.  Await pathology results 2.  High fiber diet with liberal fluid intake. 3.  Given your age, you will not need another colonoscopy for colon cancer screening or polyp surveillance.  These types of tests usually stop around the age 33. 4.  Schedule CT scan of abdomen/pelvis.  eSigned:  Ladene Artist, MD, [facility] 06/19/2015 8:28 AM

## 2015-06-19 NOTE — Patient Instructions (Signed)
Discharge instructions given. Handouts on polyps and diverticulosis. Contrast given to patient in recovery room. Office will call and schedule CT of abdomin and pelvis. Resume previous medications. YOU HAD AN ENDOSCOPIC PROCEDURE TODAY AT [facility]:   Refer to the procedure report that was given to you for any specific questions about what was found during the examination.  If the procedure report does not answer your questions, please call your gastroenterologist to clarify.  If you requested that your care partner not be given the details of your procedure findings, then the procedure report has been included in a sealed envelope for you to review at your convenience later.  YOU SHOULD EXPECT: Some feelings of bloating in the abdomen. Passage of more gas than usual.  Walking can help get rid of the air that was put into your GI tract during the procedure and reduce the bloating. If you had a lower endoscopy (such as a colonoscopy or flexible sigmoidoscopy) you may notice spotting of blood in your stool or on the toilet paper. If you underwent a bowel prep for your procedure, you may not have a normal bowel movement for a few days.  Please Note:  You might notice some irritation and congestion in your nose or some drainage.  This is from the oxygen used during your procedure.  There is no need for concern and it should clear up in a day or so.  SYMPTOMS TO REPORT IMMEDIATELY:   Following lower endoscopy (colonoscopy or flexible sigmoidoscopy):  Excessive amounts of blood in the stool  Significant tenderness or worsening of abdominal pains  Swelling of the abdomen that is new, acute  Fever of 100F or higher  For urgent or emergent issues, a gastroenterologist can be reached at any hour by calling [phone].   DIET: Your first meal following the procedure should be a small meal and then it is ok to progress to your normal diet. Heavy or fried foods are harder to digest  and may make you feel nauseous or bloated.  Likewise, meals heavy in dairy and vegetables can increase bloating.  Drink plenty of fluids but you should avoid alcoholic beverages for 24 hours.  ACTIVITY:  You should plan to take it easy for the rest of today and you should NOT DRIVE or use heavy machinery until tomorrow (because of the sedation medicines used during the test).    FOLLOW UP: Our staff will call the number listed on your records the next business day following your procedure to check on you and address any questions or concerns that you may have regarding the information given to you following your procedure. If we do not reach you, we will leave a message.  However, if you are feeling well and you are not experiencing any problems, there is no need to return our call.  We will assume that you have returned to your regular daily activities without incident.  If any biopsies were taken you will be contacted by phone or by letter within the next 1-3 weeks.  Please call us at [phone] if you have not heard about the biopsies in 3 weeks.    SIGNATURES/CONFIDENTIALITY: You and/or your care partner have signed paperwork which will be entered into your electronic medical record.  These signatures attest to the fact that that the information above on your After Visit Summary has been reviewed and is understood.  Full responsibility of the confidentiality of this discharge information lies with you and/or  your care-partner. 

## 2015-06-20 ENCOUNTER — Telehealth: Payer: Self-pay | Admitting: *Deleted

## 2015-06-20 NOTE — Progress Notes (Signed)
I have reviewed and agreed above plan. 

## 2015-06-20 NOTE — Telephone Encounter (Signed)
  Follow up Call-  Call back number 06/19/2015  Post procedure Call Back phone  # [phone]  Permission to leave phone message Yes     Patient questions:  Do you have a fever, pain , or abdominal swelling? No. Pain Score  0 *  Have you tolerated food without any problems? Yes.    Have you been able to return to your normal activities? No.  Do you have any questions about your discharge instructions: Diet   No. Medications  No. Follow up visit  No.  Do you have questions or concerns about your Care? No.  Actions: * If pain score is 4 or above: No action needed, pain <4.

## 2015-06-28 ENCOUNTER — Ambulatory Visit (INDEPENDENT_AMBULATORY_CARE_PROVIDER_SITE_OTHER)
Admission: RE | Admit: 2015-06-28 | Discharge: 2015-06-28 | Disposition: A | Discharge status: Home / Self Care | Payer: Medicare Other | Source: Ambulatory Visit | Attending: Gastroenterology | Admitting: Gastroenterology

## 2015-06-28 DIAGNOSIS — K5909 Other constipation: Secondary | ICD-10-CM

## 2015-06-28 DIAGNOSIS — R109 Unspecified abdominal pain: Secondary | ICD-10-CM | POA: Diagnosis not present

## 2015-06-28 MED ORDER — IOHEXOL 300 MG/ML  SOLN
100.0000 mL | Freq: Once | INTRAMUSCULAR | Status: AC | PRN
  Administered 2015-06-28: 100 mL via INTRAVENOUS

## 2015-06-29 ENCOUNTER — Encounter: Payer: Self-pay | Admitting: Gastroenterology

## 2015-09-14 DIAGNOSIS — R52 Pain, unspecified: Secondary | ICD-10-CM | POA: Diagnosis not present

## 2015-09-14 DIAGNOSIS — J069 Acute upper respiratory infection, unspecified: Secondary | ICD-10-CM | POA: Diagnosis not present

## 2015-10-04 DIAGNOSIS — E039 Hypothyroidism, unspecified: Secondary | ICD-10-CM | POA: Diagnosis not present

## 2015-10-29 ENCOUNTER — Other Ambulatory Visit: Payer: Self-pay

## 2015-10-29 DIAGNOSIS — Z1231 Encounter for screening mammogram for malignant neoplasm of breast: Secondary | ICD-10-CM

## 2015-11-28 ENCOUNTER — Ambulatory Visit
Admission: RE | Admit: 2015-11-28 | Discharge: 2015-11-28 | Disposition: A | Discharge status: Home / Self Care | Payer: Medicare Other | Source: Ambulatory Visit

## 2015-11-28 DIAGNOSIS — Z1231 Encounter for screening mammogram for malignant neoplasm of breast: Secondary | ICD-10-CM

## 2015-11-30 ENCOUNTER — Other Ambulatory Visit: Payer: Self-pay | Admitting: Family Medicine

## 2015-11-30 DIAGNOSIS — R928 Other abnormal and inconclusive findings on diagnostic imaging of breast: Secondary | ICD-10-CM

## 2015-12-05 DIAGNOSIS — K5792 Diverticulitis of intestine, part unspecified, without perforation or abscess without bleeding: Secondary | ICD-10-CM | POA: Diagnosis not present

## 2015-12-10 ENCOUNTER — Ambulatory Visit
Admission: RE | Admit: 2015-12-10 | Discharge: 2015-12-10 | Disposition: A | Discharge status: Home / Self Care | Payer: Medicare Other | Source: Ambulatory Visit | Attending: Family Medicine | Admitting: Family Medicine

## 2015-12-10 ENCOUNTER — Other Ambulatory Visit: Payer: Self-pay | Admitting: Family Medicine

## 2015-12-10 DIAGNOSIS — R928 Other abnormal and inconclusive findings on diagnostic imaging of breast: Secondary | ICD-10-CM

## 2015-12-27 DIAGNOSIS — E785 Hyperlipidemia, unspecified: Secondary | ICD-10-CM | POA: Diagnosis not present

## 2015-12-27 DIAGNOSIS — R7309 Other abnormal glucose: Secondary | ICD-10-CM | POA: Diagnosis not present

## 2015-12-27 DIAGNOSIS — F419 Anxiety disorder, unspecified: Secondary | ICD-10-CM | POA: Diagnosis not present

## 2015-12-27 DIAGNOSIS — E039 Hypothyroidism, unspecified: Secondary | ICD-10-CM | POA: Diagnosis not present

## 2015-12-27 DIAGNOSIS — I10 Essential (primary) hypertension: Secondary | ICD-10-CM | POA: Diagnosis not present

## 2015-12-27 DIAGNOSIS — K219 Gastro-esophageal reflux disease without esophagitis: Secondary | ICD-10-CM | POA: Diagnosis not present

## 2016-01-03 DIAGNOSIS — L814 Other melanin hyperpigmentation: Secondary | ICD-10-CM | POA: Diagnosis not present

## 2016-01-03 DIAGNOSIS — L820 Inflamed seborrheic keratosis: Secondary | ICD-10-CM | POA: Diagnosis not present

## 2016-01-03 DIAGNOSIS — D1801 Hemangioma of skin and subcutaneous tissue: Secondary | ICD-10-CM | POA: Diagnosis not present

## 2016-01-03 DIAGNOSIS — D235 Other benign neoplasm of skin of trunk: Secondary | ICD-10-CM | POA: Diagnosis not present

## 2016-02-07 DIAGNOSIS — R109 Unspecified abdominal pain: Secondary | ICD-10-CM | POA: Diagnosis not present

## 2016-02-11 ENCOUNTER — Other Ambulatory Visit: Payer: Self-pay | Admitting: Family Medicine

## 2016-02-11 DIAGNOSIS — R109 Unspecified abdominal pain: Secondary | ICD-10-CM

## 2016-02-18 ENCOUNTER — Ambulatory Visit
Admission: RE | Admit: 2016-02-18 | Discharge: 2016-02-18 | Disposition: A | Discharge status: Home / Self Care | Payer: Medicare Other | Source: Ambulatory Visit | Attending: Family Medicine | Admitting: Family Medicine

## 2016-02-18 DIAGNOSIS — R109 Unspecified abdominal pain: Secondary | ICD-10-CM

## 2016-02-18 DIAGNOSIS — R1010 Upper abdominal pain, unspecified: Secondary | ICD-10-CM | POA: Diagnosis not present

## 2016-03-20 ENCOUNTER — Other Ambulatory Visit: Payer: Self-pay | Admitting: Family Medicine

## 2016-03-20 DIAGNOSIS — R109 Unspecified abdominal pain: Secondary | ICD-10-CM | POA: Diagnosis not present

## 2016-04-01 ENCOUNTER — Ambulatory Visit
Admission: RE | Admit: 2016-04-01 | Discharge: 2016-04-01 | Disposition: A | Discharge status: Home / Self Care | Payer: Medicare Other | Source: Ambulatory Visit | Attending: Family Medicine | Admitting: Family Medicine

## 2016-04-01 DIAGNOSIS — M5124 Other intervertebral disc displacement, thoracic region: Secondary | ICD-10-CM | POA: Diagnosis not present

## 2016-04-01 DIAGNOSIS — R109 Unspecified abdominal pain: Secondary | ICD-10-CM

## 2016-04-03 DIAGNOSIS — H524 Presbyopia: Secondary | ICD-10-CM | POA: Diagnosis not present

## 2016-04-03 DIAGNOSIS — H04123 Dry eye syndrome of bilateral lacrimal glands: Secondary | ICD-10-CM | POA: Diagnosis not present

## 2016-04-10 DIAGNOSIS — Z23 Encounter for immunization: Secondary | ICD-10-CM | POA: Diagnosis not present

## 2016-04-16 DIAGNOSIS — M47814 Spondylosis without myelopathy or radiculopathy, thoracic region: Secondary | ICD-10-CM | POA: Diagnosis not present

## 2016-04-16 DIAGNOSIS — K219 Gastro-esophageal reflux disease without esophagitis: Secondary | ICD-10-CM | POA: Diagnosis not present

## 2016-04-30 DIAGNOSIS — J3489 Other specified disorders of nose and nasal sinuses: Secondary | ICD-10-CM | POA: Diagnosis not present

## 2016-04-30 DIAGNOSIS — J069 Acute upper respiratory infection, unspecified: Secondary | ICD-10-CM | POA: Diagnosis not present

## 2016-04-30 DIAGNOSIS — B9789 Other viral agents as the cause of diseases classified elsewhere: Secondary | ICD-10-CM | POA: Diagnosis not present

## 2016-06-16 DIAGNOSIS — I10 Essential (primary) hypertension: Secondary | ICD-10-CM | POA: Diagnosis not present

## 2016-06-16 DIAGNOSIS — G44209 Tension-type headache, unspecified, not intractable: Secondary | ICD-10-CM | POA: Diagnosis not present

## 2016-07-03 DIAGNOSIS — I10 Essential (primary) hypertension: Secondary | ICD-10-CM | POA: Diagnosis not present

## 2016-07-03 DIAGNOSIS — K219 Gastro-esophageal reflux disease without esophagitis: Secondary | ICD-10-CM | POA: Diagnosis not present

## 2016-07-03 DIAGNOSIS — Z0000 Encounter for general adult medical examination without abnormal findings: Secondary | ICD-10-CM | POA: Diagnosis not present

## 2016-07-03 DIAGNOSIS — F419 Anxiety disorder, unspecified: Secondary | ICD-10-CM | POA: Diagnosis not present

## 2016-07-03 DIAGNOSIS — E039 Hypothyroidism, unspecified: Secondary | ICD-10-CM | POA: Diagnosis not present

## 2016-07-03 DIAGNOSIS — M8580 Other specified disorders of bone density and structure, unspecified site: Secondary | ICD-10-CM | POA: Diagnosis not present

## 2016-07-03 DIAGNOSIS — R7303 Prediabetes: Secondary | ICD-10-CM | POA: Diagnosis not present

## 2016-07-03 DIAGNOSIS — E7800 Pure hypercholesterolemia, unspecified: Secondary | ICD-10-CM | POA: Diagnosis not present

## 2016-11-04 ENCOUNTER — Other Ambulatory Visit: Payer: Self-pay | Admitting: Family Medicine

## 2016-11-04 DIAGNOSIS — Z1231 Encounter for screening mammogram for malignant neoplasm of breast: Secondary | ICD-10-CM

## 2016-12-01 DIAGNOSIS — I10 Essential (primary) hypertension: Secondary | ICD-10-CM | POA: Diagnosis not present

## 2016-12-01 DIAGNOSIS — R609 Edema, unspecified: Secondary | ICD-10-CM | POA: Diagnosis not present

## 2016-12-11 ENCOUNTER — Ambulatory Visit: Payer: Medicare Other

## 2016-12-30 ENCOUNTER — Ambulatory Visit
Admission: RE | Admit: 2016-12-30 | Discharge: 2016-12-30 | Disposition: A | Discharge status: Home / Self Care | Payer: Medicare Other | Source: Ambulatory Visit | Attending: Family Medicine | Admitting: Family Medicine

## 2016-12-30 DIAGNOSIS — R7303 Prediabetes: Secondary | ICD-10-CM | POA: Diagnosis not present

## 2016-12-30 DIAGNOSIS — E7800 Pure hypercholesterolemia, unspecified: Secondary | ICD-10-CM | POA: Diagnosis not present

## 2016-12-30 DIAGNOSIS — I10 Essential (primary) hypertension: Secondary | ICD-10-CM | POA: Diagnosis not present

## 2016-12-30 DIAGNOSIS — Z1231 Encounter for screening mammogram for malignant neoplasm of breast: Secondary | ICD-10-CM | POA: Diagnosis not present

## 2016-12-30 DIAGNOSIS — E039 Hypothyroidism, unspecified: Secondary | ICD-10-CM | POA: Diagnosis not present

## 2017-01-01 DIAGNOSIS — L821 Other seborrheic keratosis: Secondary | ICD-10-CM | POA: Diagnosis not present

## 2017-01-01 DIAGNOSIS — D225 Melanocytic nevi of trunk: Secondary | ICD-10-CM | POA: Diagnosis not present

## 2017-01-01 DIAGNOSIS — L814 Other melanin hyperpigmentation: Secondary | ICD-10-CM | POA: Diagnosis not present

## 2017-02-10 DIAGNOSIS — M25511 Pain in right shoulder: Secondary | ICD-10-CM | POA: Diagnosis not present

## 2017-02-23 DIAGNOSIS — R194 Change in bowel habit: Secondary | ICD-10-CM | POA: Diagnosis not present

## 2017-04-01 DIAGNOSIS — J069 Acute upper respiratory infection, unspecified: Secondary | ICD-10-CM | POA: Diagnosis not present

## 2017-04-09 DIAGNOSIS — H5213 Myopia, bilateral: Secondary | ICD-10-CM | POA: Diagnosis not present

## 2017-04-16 ENCOUNTER — Ambulatory Visit: Payer: Medicare Other | Admitting: Gastroenterology

## 2017-04-20 ENCOUNTER — Other Ambulatory Visit: Payer: Self-pay | Admitting: Family Medicine

## 2017-04-20 ENCOUNTER — Ambulatory Visit
Admission: RE | Admit: 2017-04-20 | Discharge: 2017-04-20 | Disposition: A | Discharge status: Home / Self Care | Payer: Medicare Other | Source: Ambulatory Visit | Attending: Family Medicine | Admitting: Family Medicine

## 2017-04-20 DIAGNOSIS — N3090 Cystitis, unspecified without hematuria: Secondary | ICD-10-CM | POA: Diagnosis not present

## 2017-04-20 DIAGNOSIS — R05 Cough: Secondary | ICD-10-CM | POA: Diagnosis not present

## 2017-04-20 DIAGNOSIS — R059 Cough, unspecified: Secondary | ICD-10-CM

## 2017-04-20 DIAGNOSIS — R300 Dysuria: Secondary | ICD-10-CM | POA: Diagnosis not present

## 2017-04-28 DIAGNOSIS — M67911 Unspecified disorder of synovium and tendon, right shoulder: Secondary | ICD-10-CM | POA: Diagnosis not present

## 2017-05-01 DIAGNOSIS — J209 Acute bronchitis, unspecified: Secondary | ICD-10-CM | POA: Diagnosis not present

## 2017-05-01 DIAGNOSIS — R350 Frequency of micturition: Secondary | ICD-10-CM | POA: Diagnosis not present

## 2017-05-21 DIAGNOSIS — Z23 Encounter for immunization: Secondary | ICD-10-CM | POA: Diagnosis not present

## 2017-06-02 ENCOUNTER — Ambulatory Visit: Payer: Medicare Other | Admitting: Gastroenterology

## 2017-07-14 DIAGNOSIS — M85851 Other specified disorders of bone density and structure, right thigh: Secondary | ICD-10-CM | POA: Diagnosis not present

## 2017-07-14 DIAGNOSIS — I10 Essential (primary) hypertension: Secondary | ICD-10-CM | POA: Diagnosis not present

## 2017-07-14 DIAGNOSIS — B351 Tinea unguium: Secondary | ICD-10-CM | POA: Diagnosis not present

## 2017-07-14 DIAGNOSIS — Z0000 Encounter for general adult medical examination without abnormal findings: Secondary | ICD-10-CM | POA: Diagnosis not present

## 2017-07-15 ENCOUNTER — Encounter: Payer: Self-pay | Admitting: Gastroenterology

## 2017-07-15 ENCOUNTER — Ambulatory Visit: Payer: Medicare Other | Admitting: Gastroenterology

## 2017-07-15 VITALS — BP 124/72 | HR 64 | Ht 61.0 in | Wt 148.0 lb

## 2017-07-15 DIAGNOSIS — R194 Change in bowel habit: Secondary | ICD-10-CM

## 2017-07-15 DIAGNOSIS — K5901 Slow transit constipation: Secondary | ICD-10-CM

## 2017-07-15 DIAGNOSIS — R195 Other fecal abnormalities: Secondary | ICD-10-CM

## 2017-07-15 NOTE — Progress Notes (Signed)
    History of Present Illness: This is a 81 year old female returning for follow-up of constipation,  change in stool caliber. Constipation symptoms have substantially improved with increasing her Citrucel dose.  She tried MiraLAX for a period of time without using Citrucel and does not feel it was as effective.  She was evaluated by Dr. Kenton Kingfisher in July and underwent rectal exam and anoscopy which were unremarkable.  Colonoscopy performed 2 years ago below.  She has no other gastrointestinal complaints.  Colonoscopy 05/2015: 1. Moderate diverticulosis in the sigmoid colon and descending colon 2. Sessile polyp in the descending colon; polypectomy performed with a cold snare  Current Medications, Allergies, Past Medical History, Past Surgical History, Family History and Social History were reviewed in [employer] record.  Physical Exam: General: Well developed, well nourished, no acute distress Head: Normocephalic and atraumatic Eyes:  sclerae anicteric, EOMI Ears: Normal auditory acuity Mouth: No deformity or lesions Lungs: Clear throughout to auscultation Heart: Regular rate and rhythm; no murmurs, rubs or bruits Abdomen: Soft, non tender and non distended. No masses, hepatosplenomegaly or hernias noted. Normal Bowel sounds Rectal: not done Musculoskeletal: Symmetrical with no gross deformities  Pulses:  Normal pulses noted Extremities: No clubbing, cyanosis, edema or deformities noted Neurological: Alert oriented x 4, grossly nonfocal Psychological:  Alert and cooperative. Normal mood and affect  Assessment and Recommendations:  1.  Constipation and change in stool caliber.  Given colonoscopy within the past few years and recent rectal exam and anoscopy her symptoms are all related to constipation.  She is advised to remain on 1-2 tablespoons of Citrucel daily, drink at least 6 glasses of water daily and use MiraLAX between one half scoop and 2 scoops daily in  combination with Citrucel.  She will  adjust the dosing of Citrucel and MiraLAX for adequate bowel movements.  She is advised to call us within the next few weeks if constipation is not adequately controlled.

## 2017-07-15 NOTE — Patient Instructions (Signed)
Start over the counter Citracel taking 1-2 tablespoons mixed in juice/water daily and Miralax 1/2- 2 scoops mixed in juice/water daily.   Call back in a few weeks if your symptoms are no better.   Thank you for choosing me and [facility].  Pricilla Riffle. Dagoberto Ligas., MD., [facility]

## 2017-10-20 DIAGNOSIS — M25511 Pain in right shoulder: Secondary | ICD-10-CM | POA: Diagnosis not present

## 2017-10-21 DIAGNOSIS — M8588 Other specified disorders of bone density and structure, other site: Secondary | ICD-10-CM | POA: Diagnosis not present

## 2017-10-27 DIAGNOSIS — M25511 Pain in right shoulder: Secondary | ICD-10-CM | POA: Diagnosis not present

## 2017-10-27 DIAGNOSIS — M67911 Unspecified disorder of synovium and tendon, right shoulder: Secondary | ICD-10-CM | POA: Diagnosis not present

## 2017-11-02 ENCOUNTER — Other Ambulatory Visit: Payer: Self-pay | Admitting: Orthopedic Surgery

## 2017-11-02 DIAGNOSIS — M75101 Unspecified rotator cuff tear or rupture of right shoulder, not specified as traumatic: Secondary | ICD-10-CM

## 2017-11-10 ENCOUNTER — Ambulatory Visit
Admission: RE | Admit: 2017-11-10 | Discharge: 2017-11-10 | Disposition: A | Discharge status: Home / Self Care | Payer: Medicare Other | Source: Ambulatory Visit | Attending: Orthopedic Surgery | Admitting: Orthopedic Surgery

## 2017-11-10 DIAGNOSIS — S46011A Strain of muscle(s) and tendon(s) of the rotator cuff of right shoulder, initial encounter: Secondary | ICD-10-CM | POA: Diagnosis not present

## 2017-11-10 DIAGNOSIS — M25511 Pain in right shoulder: Secondary | ICD-10-CM | POA: Diagnosis not present

## 2017-11-10 DIAGNOSIS — M75101 Unspecified rotator cuff tear or rupture of right shoulder, not specified as traumatic: Secondary | ICD-10-CM

## 2017-11-10 MED ORDER — IOPAMIDOL (ISOVUE-M 200) INJECTION 41%
12.0000 mL | Freq: Once | INTRAMUSCULAR | Status: AC
  Administered 2017-11-10: 12 mL via INTRA_ARTICULAR

## 2017-11-16 DIAGNOSIS — M67911 Unspecified disorder of synovium and tendon, right shoulder: Secondary | ICD-10-CM | POA: Diagnosis not present

## 2017-11-18 DIAGNOSIS — M25511 Pain in right shoulder: Secondary | ICD-10-CM | POA: Diagnosis not present

## 2017-11-18 DIAGNOSIS — M75111 Incomplete rotator cuff tear or rupture of right shoulder, not specified as traumatic: Secondary | ICD-10-CM | POA: Diagnosis not present

## 2017-11-24 DIAGNOSIS — M75111 Incomplete rotator cuff tear or rupture of right shoulder, not specified as traumatic: Secondary | ICD-10-CM | POA: Diagnosis not present

## 2017-11-24 DIAGNOSIS — M25511 Pain in right shoulder: Secondary | ICD-10-CM | POA: Diagnosis not present

## 2017-11-25 ENCOUNTER — Other Ambulatory Visit: Payer: Self-pay | Admitting: Family Medicine

## 2017-11-25 DIAGNOSIS — Z1231 Encounter for screening mammogram for malignant neoplasm of breast: Secondary | ICD-10-CM

## 2017-11-26 DIAGNOSIS — M75111 Incomplete rotator cuff tear or rupture of right shoulder, not specified as traumatic: Secondary | ICD-10-CM | POA: Diagnosis not present

## 2017-11-26 DIAGNOSIS — M25511 Pain in right shoulder: Secondary | ICD-10-CM | POA: Diagnosis not present

## 2017-12-01 DIAGNOSIS — M25511 Pain in right shoulder: Secondary | ICD-10-CM | POA: Diagnosis not present

## 2017-12-01 DIAGNOSIS — M75111 Incomplete rotator cuff tear or rupture of right shoulder, not specified as traumatic: Secondary | ICD-10-CM | POA: Diagnosis not present

## 2017-12-11 DIAGNOSIS — M25511 Pain in right shoulder: Secondary | ICD-10-CM | POA: Diagnosis not present

## 2017-12-11 DIAGNOSIS — M75111 Incomplete rotator cuff tear or rupture of right shoulder, not specified as traumatic: Secondary | ICD-10-CM | POA: Diagnosis not present

## 2017-12-16 DIAGNOSIS — R1013 Epigastric pain: Secondary | ICD-10-CM | POA: Diagnosis not present

## 2017-12-17 ENCOUNTER — Other Ambulatory Visit: Payer: Self-pay | Admitting: Family Medicine

## 2017-12-17 DIAGNOSIS — R1013 Epigastric pain: Secondary | ICD-10-CM

## 2017-12-17 DIAGNOSIS — M75111 Incomplete rotator cuff tear or rupture of right shoulder, not specified as traumatic: Secondary | ICD-10-CM | POA: Diagnosis not present

## 2017-12-17 DIAGNOSIS — M25511 Pain in right shoulder: Secondary | ICD-10-CM | POA: Diagnosis not present

## 2017-12-22 DIAGNOSIS — M75121 Complete rotator cuff tear or rupture of right shoulder, not specified as traumatic: Secondary | ICD-10-CM | POA: Diagnosis not present

## 2017-12-22 DIAGNOSIS — S63637A Sprain of interphalangeal joint of left little finger, initial encounter: Secondary | ICD-10-CM | POA: Diagnosis not present

## 2017-12-29 DIAGNOSIS — M67911 Unspecified disorder of synovium and tendon, right shoulder: Secondary | ICD-10-CM | POA: Diagnosis not present

## 2017-12-30 ENCOUNTER — Ambulatory Visit
Admission: RE | Admit: 2017-12-30 | Discharge: 2017-12-30 | Disposition: A | Discharge status: Home / Self Care | Payer: Medicare Other | Source: Ambulatory Visit | Attending: Family Medicine | Admitting: Family Medicine

## 2017-12-30 DIAGNOSIS — R1013 Epigastric pain: Secondary | ICD-10-CM | POA: Diagnosis not present

## 2017-12-30 MED ORDER — IOPAMIDOL (ISOVUE-300) INJECTION 61%
100.0000 mL | Freq: Once | INTRAVENOUS | Status: AC | PRN
  Administered 2017-12-30: 100 mL via INTRAVENOUS

## 2018-01-01 DIAGNOSIS — L03115 Cellulitis of right lower limb: Secondary | ICD-10-CM | POA: Diagnosis not present

## 2018-01-04 DIAGNOSIS — L819 Disorder of pigmentation, unspecified: Secondary | ICD-10-CM | POA: Diagnosis not present

## 2018-01-04 DIAGNOSIS — L814 Other melanin hyperpigmentation: Secondary | ICD-10-CM | POA: Diagnosis not present

## 2018-01-04 DIAGNOSIS — D1801 Hemangioma of skin and subcutaneous tissue: Secondary | ICD-10-CM | POA: Diagnosis not present

## 2018-01-04 DIAGNOSIS — L821 Other seborrheic keratosis: Secondary | ICD-10-CM | POA: Diagnosis not present

## 2018-01-05 ENCOUNTER — Emergency Department (HOSPITAL_COMMUNITY): Payer: Medicare Other

## 2018-01-05 ENCOUNTER — Encounter (HOSPITAL_COMMUNITY): Payer: Self-pay | Admitting: Emergency Medicine

## 2018-01-05 ENCOUNTER — Observation Stay (HOSPITAL_COMMUNITY)
Admission: EM | Admit: 2018-01-05 | Discharge: 2018-01-06 | Disposition: A | Discharge status: Home / Self Care | Payer: Medicare Other | Attending: Family Medicine | Admitting: Family Medicine

## 2018-01-05 ENCOUNTER — Other Ambulatory Visit: Payer: Self-pay

## 2018-01-05 DIAGNOSIS — Z7982 Long term (current) use of aspirin: Secondary | ICD-10-CM | POA: Diagnosis not present

## 2018-01-05 DIAGNOSIS — F419 Anxiety disorder, unspecified: Secondary | ICD-10-CM | POA: Diagnosis not present

## 2018-01-05 DIAGNOSIS — G8929 Other chronic pain: Secondary | ICD-10-CM

## 2018-01-05 DIAGNOSIS — R0902 Hypoxemia: Secondary | ICD-10-CM | POA: Insufficient documentation

## 2018-01-05 DIAGNOSIS — K219 Gastro-esophageal reflux disease without esophagitis: Secondary | ICD-10-CM | POA: Diagnosis not present

## 2018-01-05 DIAGNOSIS — R9431 Abnormal electrocardiogram [ECG] [EKG]: Secondary | ICD-10-CM | POA: Insufficient documentation

## 2018-01-05 DIAGNOSIS — I700 Atherosclerosis of aorta: Secondary | ICD-10-CM | POA: Diagnosis not present

## 2018-01-05 DIAGNOSIS — M546 Pain in thoracic spine: Secondary | ICD-10-CM | POA: Insufficient documentation

## 2018-01-05 DIAGNOSIS — K2970 Gastritis, unspecified, without bleeding: Secondary | ICD-10-CM | POA: Insufficient documentation

## 2018-01-05 DIAGNOSIS — R001 Bradycardia, unspecified: Secondary | ICD-10-CM | POA: Diagnosis not present

## 2018-01-05 DIAGNOSIS — E785 Hyperlipidemia, unspecified: Secondary | ICD-10-CM | POA: Insufficient documentation

## 2018-01-05 DIAGNOSIS — M545 Low back pain: Secondary | ICD-10-CM

## 2018-01-05 DIAGNOSIS — E039 Hypothyroidism, unspecified: Secondary | ICD-10-CM | POA: Diagnosis not present

## 2018-01-05 DIAGNOSIS — Z880 Allergy status to penicillin: Secondary | ICD-10-CM | POA: Diagnosis not present

## 2018-01-05 DIAGNOSIS — I493 Ventricular premature depolarization: Secondary | ICD-10-CM | POA: Diagnosis not present

## 2018-01-05 DIAGNOSIS — K5730 Diverticulosis of large intestine without perforation or abscess without bleeding: Secondary | ICD-10-CM | POA: Diagnosis not present

## 2018-01-05 DIAGNOSIS — R079 Chest pain, unspecified: Secondary | ICD-10-CM | POA: Diagnosis not present

## 2018-01-05 DIAGNOSIS — Z8673 Personal history of transient ischemic attack (TIA), and cerebral infarction without residual deficits: Secondary | ICD-10-CM | POA: Insufficient documentation

## 2018-01-05 DIAGNOSIS — I10 Essential (primary) hypertension: Secondary | ICD-10-CM | POA: Diagnosis not present

## 2018-01-05 DIAGNOSIS — R0789 Other chest pain: Secondary | ICD-10-CM

## 2018-01-05 DIAGNOSIS — Z8249 Family history of ischemic heart disease and other diseases of the circulatory system: Secondary | ICD-10-CM | POA: Insufficient documentation

## 2018-01-05 DIAGNOSIS — Z882 Allergy status to sulfonamides status: Secondary | ICD-10-CM | POA: Diagnosis not present

## 2018-01-05 LAB — HEPATIC FUNCTION PANEL
ALBUMIN: 3.9 g/dL (ref 3.5–5.0)
ALK PHOS: 56 U/L (ref 38–126)
ALT: 20 U/L (ref 14–54)
AST: 19 U/L (ref 15–41)
BILIRUBIN INDIRECT: 0.9 mg/dL (ref 0.3–0.9)
Bilirubin, Direct: 0.2 mg/dL (ref 0.1–0.5)
TOTAL PROTEIN: 7 g/dL (ref 6.5–8.1)
Total Bilirubin: 1.1 mg/dL (ref 0.3–1.2)

## 2018-01-05 LAB — HEMOGLOBIN A1C
HEMOGLOBIN A1C: 5.8 % — AB (ref 4.8–5.6)
Mean Plasma Glucose: 119.76 mg/dL

## 2018-01-05 LAB — CBC
HCT: 44.5 % (ref 36.0–46.0)
HEMOGLOBIN: 14.5 g/dL (ref 12.0–15.0)
MCH: 30.3 pg (ref 26.0–34.0)
MCHC: 32.6 g/dL (ref 30.0–36.0)
MCV: 92.9 fL (ref 78.0–100.0)
Platelets: 253 10*3/uL (ref 150–400)
RBC: 4.79 MIL/uL (ref 3.87–5.11)
RDW: 12.4 % (ref 11.5–15.5)
WBC: 8.5 10*3/uL (ref 4.0–10.5)

## 2018-01-05 LAB — LIPID PANEL
CHOL/HDL RATIO: 2.5 ratio
CHOLESTEROL: 167 mg/dL (ref 0–200)
HDL: 67 mg/dL (ref >40)
LDL Cholesterol: 78 mg/dL (ref 0–99)
TRIGLYCERIDES: 110 mg/dL (ref <150)
VLDL: 22 mg/dL (ref 0–40)

## 2018-01-05 LAB — BASIC METABOLIC PANEL
Anion gap: 9 (ref 5–15)
BUN: 17 mg/dL (ref 6–20)
CALCIUM: 9.8 mg/dL (ref 8.9–10.3)
CO2: 26 mmol/L (ref 22–32)
Chloride: 104 mmol/L (ref 101–111)
Creatinine, Ser: 0.72 mg/dL (ref 0.44–1.00)
GLUCOSE: 111 mg/dL — AB (ref 65–99)
POTASSIUM: 3.9 mmol/L (ref 3.5–5.1)
SODIUM: 139 mmol/L (ref 135–145)

## 2018-01-05 LAB — I-STAT TROPONIN, ED
TROPONIN I, POC: 0.01 ng/mL (ref 0.00–0.08)
TROPONIN I, POC: 0.01 ng/mL (ref 0.00–0.08)

## 2018-01-05 LAB — MAGNESIUM: Magnesium: 2.1 mg/dL (ref 1.7–2.4)

## 2018-01-05 LAB — PHOSPHORUS: PHOSPHORUS: 3.8 mg/dL (ref 2.5–4.6)

## 2018-01-05 LAB — BRAIN NATRIURETIC PEPTIDE: B NATRIURETIC PEPTIDE 5: 30.7 pg/mL (ref 0.0–100.0)

## 2018-01-05 LAB — TSH: TSH: 4.143 u[IU]/mL (ref 0.350–4.500)

## 2018-01-05 LAB — TROPONIN I: Troponin I: <0.03 ng/mL (ref <0.03)

## 2018-01-05 MED ORDER — ACETAMINOPHEN 325 MG PO TABS: 650.0000 mg | ORAL_TABLET | ORAL | Status: DC | PRN

## 2018-01-05 MED ORDER — IOPAMIDOL (ISOVUE-370) INJECTION 76%
100.0000 mL | Freq: Once | INTRAVENOUS | Status: AC | PRN
  Administered 2018-01-05: 100 mL via INTRAVENOUS

## 2018-01-05 MED ORDER — ENOXAPARIN SODIUM 40 MG/0.4ML ~~LOC~~ SOLN: 40.0000 mg | SUBCUTANEOUS | Status: DC

## 2018-01-05 MED ORDER — LORAZEPAM 0.5 MG PO TABS
0.5000 mg | ORAL_TABLET | Freq: Two times a day (BID) | ORAL | Status: DC | PRN
  Administered 2018-01-06: 0.5 mg via ORAL
  Filled 2018-01-05: qty 1

## 2018-01-05 MED ORDER — FUROSEMIDE 10 MG/ML IJ SOLN
40.0000 mg | Freq: Once | INTRAMUSCULAR | Status: AC
  Administered 2018-01-05: 40 mg via INTRAVENOUS
  Filled 2018-01-05: qty 4

## 2018-01-05 MED ORDER — CEPHALEXIN 500 MG PO CAPS
500.0000 mg | ORAL_CAPSULE | Freq: Two times a day (BID) | ORAL | Status: DC
  Administered 2018-01-05 – 2018-01-06 (×2): 500 mg via ORAL
  Filled 2018-01-05 (×3): qty 1

## 2018-01-05 MED ORDER — FENTANYL CITRATE (PF) 100 MCG/2ML IJ SOLN
50.0000 ug | Freq: Once | INTRAMUSCULAR | Status: AC
  Administered 2018-01-05: 50 ug via INTRAVENOUS
  Filled 2018-01-05: qty 2

## 2018-01-05 MED ORDER — ACETAMINOPHEN 325 MG PO TABS
650.0000 mg | ORAL_TABLET | Freq: Four times a day (QID) | ORAL | Status: DC
  Administered 2018-01-05 – 2018-01-06 (×3): 650 mg via ORAL
  Filled 2018-01-05 (×3): qty 2

## 2018-01-05 MED ORDER — GI COCKTAIL ~~LOC~~
30.0000 mL | Freq: Once | ORAL | Status: AC
  Administered 2018-01-05: 30 mL via ORAL
  Filled 2018-01-05: qty 30

## 2018-01-05 MED ORDER — LOSARTAN POTASSIUM 50 MG PO TABS
100.0000 mg | ORAL_TABLET | Freq: Every day | ORAL | Status: DC
  Administered 2018-01-06: 100 mg via ORAL
  Filled 2018-01-05: qty 2

## 2018-01-05 MED ORDER — ASPIRIN EC 81 MG PO TBEC
81.0000 mg | DELAYED_RELEASE_TABLET | Freq: Every day | ORAL | Status: DC
  Administered 2018-01-06: 81 mg via ORAL
  Filled 2018-01-05: qty 1

## 2018-01-05 MED ORDER — ADULT MULTIVITAMIN W/MINERALS CH
1.0000 | ORAL_TABLET | Freq: Every day | ORAL | Status: DC
  Administered 2018-01-06: 1 via ORAL
  Filled 2018-01-05: qty 1

## 2018-01-05 MED ORDER — ONDANSETRON HCL 4 MG/2ML IJ SOLN
4.0000 mg | Freq: Four times a day (QID) | INTRAMUSCULAR | Status: DC | PRN
  Administered 2018-01-06: 4 mg via INTRAVENOUS
  Filled 2018-01-05: qty 2

## 2018-01-05 MED ORDER — ROSUVASTATIN CALCIUM 20 MG PO TABS
20.0000 mg | ORAL_TABLET | Freq: Every day | ORAL | Status: DC
  Administered 2018-01-06: 20 mg via ORAL
  Filled 2018-01-05: qty 1

## 2018-01-05 MED ORDER — ENOXAPARIN SODIUM 40 MG/0.4ML ~~LOC~~ SOLN
40.0000 mg | SUBCUTANEOUS | Status: DC
  Administered 2018-01-06: 40 mg via SUBCUTANEOUS
  Filled 2018-01-05: qty 0.4

## 2018-01-05 MED ORDER — ASPIRIN EC 81 MG PO TBEC: 162.0000 mg | DELAYED_RELEASE_TABLET | Freq: Every day | ORAL | Status: DC

## 2018-01-05 MED ORDER — IOPAMIDOL (ISOVUE-370) INJECTION 76%
INTRAVENOUS | Status: AC
  Filled 2018-01-05: qty 100

## 2018-01-05 MED ORDER — PANTOPRAZOLE SODIUM 40 MG PO TBEC
40.0000 mg | DELAYED_RELEASE_TABLET | Freq: Every day | ORAL | Status: DC
Start: 2018-01-06 — End: 2018-01-06
  Administered 2018-01-06: 40 mg via ORAL
  Filled 2018-01-05: qty 1

## 2018-01-05 MED ORDER — ONDANSETRON HCL 4 MG/2ML IJ SOLN
4.0000 mg | Freq: Once | INTRAMUSCULAR | Status: AC
  Administered 2018-01-05: 4 mg via INTRAVENOUS
  Filled 2018-01-05: qty 2

## 2018-01-05 MED ORDER — LEVOTHYROXINE SODIUM 25 MCG PO TABS
25.0000 ug | ORAL_TABLET | Freq: Every day | ORAL | Status: DC
  Administered 2018-01-06: 25 ug via ORAL
  Filled 2018-01-05: qty 1

## 2018-01-05 MED ORDER — AMLODIPINE BESYLATE 5 MG PO TABS
5.0000 mg | ORAL_TABLET | Freq: Every day | ORAL | Status: DC
  Administered 2018-01-06: 5 mg via ORAL
  Filled 2018-01-05: qty 1

## 2018-01-05 NOTE — ED Notes (Signed)
Pt's O2 decreased to 88% on room air. Pt placed on 2L [place], sats increased to 98%. Will continue to monitor.

## 2018-01-05 NOTE — ED Notes (Signed)
Pt in CT.

## 2018-01-05 NOTE — ED Provider Notes (Signed)
[facility] DEPART[facility] Provider Note   CSN: [id number] Arrival date & time: 01/05/18  1103     History   Chief Complaint Chief Complaint  Patient presents with  . Back Pain  . Chest Pain  . Nausea  . Palpitations    HPI Jasmine Smith is a 82 y.o. female.  Patient is a 82 year old female with a history of hypertension, hyperlipidemia and prior CVA who presents with chest and back pain.  She has had some intermittent pain in her upper back for a couple months but is been persistent over the last 2 or 3 days.  She states it is a constant throbbing pain in the middle of her upper back.  It is nonradiating.  She denies any radiation down her arms.  No numbness or weakness in her extremities.  She states it is a little bit worse with movement.  It is not worse with breathing.  She also has some tightness/heavy feeling across her chest.  She is not sure if it is related to the back pain but it has been going on doing the same amount of time for the last 2 to 3 days.  She does have some associated shortness of breath at times although she denies any current shortness of breath.  She had some nausea but no vomiting.  She has been feeling fatigued and generally weak.  She denies any current abdominal pain.  She did have some upper abdominal pain recently and had a CT scan of the abdomen and pelvis which showed no acute abnormalities.  She denies any fevers.  No urinary symptoms.  No history of heart problems in the past.     Past Medical History:  Diagnosis Date  . Anxiety   . CVA (cerebral infarction)    pt stated MRI 2014 showed mini strokes  . Diverticulosis   . Esophageal reflux   . GERD (gastroesophageal reflux disease)   . Hyperlipidemia   . Hypertension   . PVC (premature ventricular contraction)   . Tubular adenoma of colon 05/2015    Patient Active Problem List   Diagnosis Date Noted  . Chest pain 01/05/2018  . Cervicalgia 10/07/2013  . Right arm  weakness 10/06/2013  . Weakness 06/26/2011  . PVC's (premature ventricular contractions) 06/26/2011  . Hyperlipidemia 06/26/2011    Past Surgical History:  Procedure Laterality Date  . ABDOMINAL HYSTERECTOMY  2002   with bladder tact  . APPENDECTOMY    . Bladder tack and sling  2002  . BREAST CYST ASPIRATION Right   . BREAST CYST EXCISION Left    x 2  . BREAST EXCISIONAL BIOPSY Right 1996   benign  . BREAST EXCISIONAL BIOPSY Right 1992   benign  . BREAST EXCISIONAL BIOPSY Left 1987   benign  . CHOLECYSTECTOMY  1986  . GANGLION CYST EXCISION Right    wrist  . LUNG REMOVAL, PARTIAL     RML  . Right rotator cuff surgery Right 2009  . SHOULDER SURGERY Left 07/2014     OB History   None      Home Medications    Prior to Admission medications   Medication Sig Start Date End Date Taking? Authorizing Provider  amLODipine (NORVASC) 5 MG tablet Take 5 mg by mouth daily.  09/23/13  Yes [provider]  aspirin 81 MG tablet Take 162 mg by mouth daily.    Yes [provider]  Biotin 5000 MCG CAPS Take 1 capsule by  mouth daily.    Yes [provider]  KRILL OIL PO Take 1 capsule by mouth daily.     Yes [provider]  levothyroxine (SYNTHROID, LEVOTHROID) 25 MCG tablet Take 25 mcg by mouth daily. 11/14/17  Yes [provider]  LORazepam (ATIVAN) 0.5 MG tablet Take 0.5 mg by mouth 2 (two) times daily.  09/05/13  Yes [provider]  losartan (COZAAR) 100 MG tablet Take 100 mg by mouth daily.  09/17/13  Yes [provider]  Methylcellulose, Laxative, (CITRUCEL PO) Take by mouth. 2 tablespoons daily   Yes [provider]  metoprolol succinate (TOPROL-XL) 25 MG 24 hr tablet Take 25 mg by mouth daily.  01/02/15  Yes [provider]  Multiple Vitamin (MULTIVITAMIN) tablet Take 1 tablet by mouth daily.     Yes [provider]  simvastatin (ZOCOR) 20 MG tablet Take 20 mg by mouth daily.    Yes [provider]  sucralfate (CARAFATE) 1 g tablet Take 1 g by mouth 2 (two) times daily. 12/16/17  Yes [provider]    Family History Family History  Problem Relation Age of Onset  . Diabetes Mother   . Heart failure Father   . Lung cancer Father   . Heart attack Brother        Died of MI at age 67  . Prostate cancer Brother        x 2  . Colon polyps Sister   . Diabetes Brother   . Diabetes Maternal Aunt        x 5  . Stomach cancer Paternal Grandfather   . Stomach cancer Paternal Grandmother   . Colon cancer Neg Hx     Social History Social History   Tobacco Use  . Smoking status: Never Smoker  . Smokeless tobacco: Never Used  Substance Use Topics  . Alcohol use: No  . Drug use: No     Allergies   Augmentin [amoxicillin-pot clavulanate]; Clarithromycin; Levaquin  [levofloxacin in d5w]; Levofloxacin; Sulfa antibiotics; and Sulfa drugs cross reactors   Review of Systems Review of Systems  Constitutional: Positive for fatigue. Negative for chills, diaphoresis and fever.  HENT: Negative for congestion, rhinorrhea and sneezing.   Eyes: Negative.   Respiratory: Positive for shortness of breath. Negative for cough and chest tightness.   Cardiovascular: Positive for chest pain. Negative for leg swelling.  Gastrointestinal: Negative for abdominal pain, blood in stool, diarrhea, nausea and vomiting.  Genitourinary: Negative for difficulty urinating, flank pain, frequency and hematuria.  Musculoskeletal: Positive for back pain. Negative for arthralgias.  Skin: Negative for rash.  Neurological: Negative for dizziness, speech difficulty, weakness, numbness and headaches.     Physical Exam Updated Vital Signs BP (!) 150/53   Pulse (!) 44   Temp 97.7 F (36.5 C) (Oral)   Resp 13   Ht 5' 2.5" (1.588 m)   Wt 66.2 kg (146 lb)   SpO2 97%   BMI 26.28 kg/m   Physical Exam  Constitutional: She is oriented to person, place, and time. She appears well-developed  and well-nourished.  HENT:  Head: Normocephalic and atraumatic.  Eyes: Pupils are equal, round, and reactive to light.  Neck: Normal range of motion. Neck supple.  Cardiovascular: Normal rate, regular rhythm and normal heart sounds.  Pulmonary/Chest: Effort normal and breath sounds normal. No respiratory distress. She has no wheezes. She has no rales. She exhibits no tenderness.  Abdominal: Soft. Bowel sounds are normal. There is no  tenderness. There is no rebound and no guarding.  Musculoskeletal: Normal range of motion. She exhibits no edema.  Patient does have some tenderness in her mid thoracic spine.  No step-offs or deformities.  Lymphadenopathy:    She has no cervical adenopathy.  Neurological: She is alert and oriented to person, place, and time. She has normal strength. No sensory deficit.  Skin: Skin is warm and dry. No rash noted.  Psychiatric: She has a normal mood and affect.     ED Treatments / Results  Labs (all labs ordered are listed, but only abnormal results are displayed) Labs Reviewed  BASIC METABOLIC PANEL - Abnormal; Notable for the following components:      Result Value   Glucose, Bld 111 (*)    All other components within normal limits  CBC  I-STAT TROPONIN, ED  I-STAT TROPONIN, ED    EKG EKG Interpretation  Date/Time:  Tuesday January 05 2018 11:11:45 EDT Ventricular Rate:  49 PR Interval:  174 QRS Duration: 98 QT Interval:  464 QTC Calculation: 419 R Axis:   55 Text Interpretation:  Sinus bradycardia Possible Anterior infarct , age undetermined Abnormal ECG since last tracing no significant change Confirmed by Malvin Johns [id number]) on 01/05/2018 3:06:01 PM   Radiology Dg Chest 2 View  Result Date: 01/05/2018 CLINICAL DATA:  Chest pain.  Hypertension EXAM: CHEST - 2 VIEW COMPARISON:  April 20, 2017 FINDINGS: There is stable scarring in the right middle lobe. There is no edema or consolidation. Heart size and pulmonary vascularity are normal.  No adenopathy. There is degenerative change in the thoracic spine. IMPRESSION: Right middle lobe scarring. No edema or consolidation. No adenopathy evident. Electronically Signed   By: Lowella Grip III M.D.   On: 01/05/2018 11:53   Ct Angio Chest Aorta W/cm &/or Wo/cm  Result Date: 01/05/2018 CLINICAL DATA:  Chest pain.  Nausea for 3 days. EXAM: CT ANGIOGRAPHY CHEST WITH CONTRAST TECHNIQUE: Multidetector CT imaging of the chest was performed using the standard protocol during bolus administration of intravenous contrast. Multiplanar CT image reconstructions and MIPs were obtained to evaluate the vascular anatomy. CONTRAST:  146mL ISOVUE-370 IOPAMIDOL (ISOVUE-370) INJECTION 76% COMPARISON:  Plain film earlier today.  No prior CT. FINDINGS: Cardiovascular: The quality of this exam for evaluation of pulmonary embolism is good. No evidence of pulmonary embolism. Aortic atherosclerosis. Mild cardiomegaly, without pericardial effusion. Mediastinum/Nodes: No mediastinal or hilar adenopathy. Lungs/Pleura: No pleural fluid. Bibasilar dependent atelectasis and volume loss. Upper Abdomen: Contrast reflux into the IVC and hepatic veins, suggesting elevated right heart pressures. Normal imaged portions of the spleen, pancreas, adrenal glands, kidneys. Cholecystectomy. Apparent gastric wall thickening is at least partially due to underdistention. Musculoskeletal: Moderate midthoracic spondylosis. Review of the MIP images confirms the above findings. IMPRESSION: 1.  No evidence of pulmonary embolism. 2. Cardiomegaly. Contrast reflux into the hepatic veins and IVC, suggesting elevated right heart pressures. 3.  Aortic Atherosclerosis (ICD10-I70.0). 4. Gastric underdistention. Apparent wall thickening is favored to be secondary. Correlate with any symptoms to suggest gastritis. Electronically Signed   By: Abigail Miyamoto M.D.   On: 01/05/2018 18:05    Procedures Procedures (including critical care time)  Medications  Ordered in ED Medications  iopamidol (ISOVUE-370) 76 % injection (has no administration in time range)  furosemide (LASIX) injection 40 mg (has no administration in time range)  fentaNYL (SUBLIMAZE) injection 50 mcg (50 mcg Intravenous Given 01/05/18 1621)  ondansetron (ZOFRAN) injection 4 mg (4 mg Intravenous Given 01/05/18 1620)  iopamidol (  ISOVUE-370) 76 % injection 100 mL (100 mLs Intravenous Contrast Given 01/05/18 1722)     Initial Impression / Assessment and Plan / ED Course  I have reviewed the triage vital signs and the nursing notes.  Pertinent labs & imaging results that were available during my care of the patient were reviewed by me and considered in my medical decision making (see chart for details).     Patient is a 82 year old female who presents with chest and back pain.  She has midline upper thoracic back pain which is been intermittent for 2 months but worse over the last 2 to 3 days.  This seems to be possibly musculoskeletal in its reproducible on palpation of her mid thoracic spine.  There is no evidence of bony abnormalities on imaging studies.  She is neurologically intact.  She does have some associated chest pain associated with shortness of breath and nausea.  Her EKG does not show any ischemic changes.  Her troponin is negative.  Her chest x-ray is clear without evidence of pneumonia or pneumothorax.  Given that she has had chest and back pain, I did do a CT scan of her chest to rule out aortic dissection.  CT scan was negative for PE or dissection.  Her pain has improved after treatment in the ED.  I spoke with the hospitalist Dr. Jonnie Finner who will admit the patient for further treatment.  She is bradycardic but is on a beta-blocker and has had bradycardia in the past as well.  She was noted to be slightly hypoxic after treatment in the ED.  She required nasal cannula and is doing fine on nasal cannula.  She does not report shortness of breath.  I feel this is likely due to  her medication in the ED.  Final Clinical Impressions(s) / ED Diagnoses   Final diagnoses:  Chest pain, unspecified type  Acute midline thoracic back pain    ED Discharge Orders    None       Malvin Johns, MD 01/05/18 1927

## 2018-01-05 NOTE — ED Triage Notes (Signed)
Pt. Stated, Jasmine Smith been having some heart pounding, back pain and some chest pain just pounding. Ive also had some nausea.

## 2018-01-05 NOTE — ED Notes (Signed)
Attempted report x 2 

## 2018-01-05 NOTE — ED Notes (Signed)
Dinner tray ordered heart health per [employer]

## 2018-01-05 NOTE — H&P (Addendum)
History and Physical    Jasmine Smith WCH:[id number] DOB: 13-May-1936 DOA: 01/05/2018  PCP: Shirline Frees, MD Patient coming from: Home  I have personally briefly reviewed patient's old medical records in [place]  Chief Complaint: 3 months back pain  HPI: Jasmine Smith is a 82 y.o. female with medical history significant for CVA, hypertension, hyperlipidemia and GERD who presented to the ED from home with 3 months of progressively worsening throbbing in her mid back and occasional nausea with epigastric discomfort.  Patient reports that the back pain started not long after her fall which she sustained in March with a right shoulder injury.  It has been worsening over that time and is now bothersome almost the entire day.  She has been taking Tylenol without significant relief of her symptoms.  She states that the pain is improved with heating pad.  She is unaware of any exacerbating symptoms.  She notes that on occasion she experiences a twinge of epigastric discomfort and nausea, without diaphoresis, shortness of breath, palpitations, vomiting.  She denies any worsening or improvement of her epigastric discomfort with food intake.  She has a known history of gastritis and has been taking Carafate twice daily.  There is no strong history of cardiovascular disease and patient is a non-smoker.  ED Course: In the ED, patient is afebrile, bradycardic into the 40s, normotensive and had a reported desaturation event to 88 and was thus placed on 2 L supplemental oxygen.  Labs notable for troponin of 0.012.  EKG shows sinus bradycardia with T wave flattening in lead V2, otherwise unremarkable.  Chest x-ray showed mild right middle lobe scarring.  CTA chest, abdomen, pelvis showed no evidence for PE, dissection or aneurysm, contrast reflux into the IVC and hepatic veins suggesting elevated right-sided pressures, no acute lung or abdominopelvic abnormalities; possible gastric wall thickening  noted.  Review of Systems: As per HPI otherwise 10 point review of systems negative.   Past Medical History:  Diagnosis Date  . Anxiety   . CVA (cerebral infarction)    pt stated MRI 2014 showed mini strokes  . Diverticulosis   . Esophageal reflux   . GERD (gastroesophageal reflux disease)   . Hyperlipidemia   . Hypertension   . PVC (premature ventricular contraction)   . Tubular adenoma of colon 05/2015    Past Surgical History:  Procedure Laterality Date  . ABDOMINAL HYSTERECTOMY  2002   with bladder tact  . APPENDECTOMY    . Bladder tack and sling  2002  . BREAST CYST ASPIRATION Right   . BREAST CYST EXCISION Left    x 2  . BREAST EXCISIONAL BIOPSY Right 1996   benign  . BREAST EXCISIONAL BIOPSY Right 1992   benign  . BREAST EXCISIONAL BIOPSY Left 1987   benign  . CHOLECYSTECTOMY  1986  . GANGLION CYST EXCISION Right    wrist  . LUNG REMOVAL, PARTIAL     RML  . Right rotator cuff surgery Right 2009  . SHOULDER SURGERY Left 07/2014     reports that she has never smoked. She has never used smokeless tobacco. She reports that she does not drink alcohol or use drugs.  Allergies  Allergen Reactions  . Augmentin [Amoxicillin-Pot Clavulanate] Other (See Comments)    Abdominal pain  . Clarithromycin   . Levaquin  [Levofloxacin In D5w]   . Levofloxacin     Poor Indigestion   . Sulfa Antibiotics   . Sulfa Drugs Cross Reactors  Family History  Problem Relation Age of Onset  . Diabetes Mother   . Heart failure Father   . Lung cancer Father   . Heart attack Brother        Died of MI at age 29  . Prostate cancer Brother        x 2  . Colon polyps Sister   . Diabetes Brother   . Diabetes Maternal Aunt        x 5  . Stomach cancer Paternal Grandfather   . Stomach cancer Paternal Grandmother   . Colon cancer Neg Hx     Prior to Admission medications   Medication Sig Start Date End Date Taking? Authorizing Provider  amLODipine (NORVASC) 5 MG tablet Take  5 mg by mouth daily.  09/23/13  Yes [provider]  aspirin 81 MG tablet Take 162 mg by mouth daily.    Yes [provider]  Biotin 5000 MCG CAPS Take 1 capsule by mouth daily.    Yes [provider]  KRILL OIL PO Take 1 capsule by mouth daily.     Yes [provider]  levothyroxine (SYNTHROID, LEVOTHROID) 25 MCG tablet Take 25 mcg by mouth daily. 11/14/17  Yes [provider]  LORazepam (ATIVAN) 0.5 MG tablet Take 0.5 mg by mouth 2 (two) times daily.  09/05/13  Yes [provider]  losartan (COZAAR) 100 MG tablet Take 100 mg by mouth daily.  09/17/13  Yes [provider]  Methylcellulose, Laxative, (CITRUCEL PO) Take by mouth. 2 tablespoons daily   Yes [provider]  metoprolol succinate (TOPROL-XL) 25 MG 24 hr tablet Take 25 mg by mouth daily.  01/02/15  Yes [provider]  Multiple Vitamin (MULTIVITAMIN) tablet Take 1 tablet by mouth daily.     Yes [provider]  simvastatin (ZOCOR) 20 MG tablet Take 20 mg by mouth daily.    Yes [provider]  sucralfate (CARAFATE) 1 g tablet Take 1 g by mouth 2 (two) times daily. 12/16/17  Yes [provider]    Physical Exam: Vitals:   01/05/18 1600 01/05/18 1645 01/05/18 1945 01/05/18 2028  BP: (!) 154/59 (!) 150/53 (!) 140/49   Pulse: (!) 49 (!) 44 (!) 51   Resp: 20 13 13    Temp:      TempSrc:      SpO2: 99% 97% 98%   Weight:    65.7 kg (144 lb 12.8 oz)  Height:    5\' 3"  (1.6 m)    Constitutional: NAD, calm, comfortable Eyes: PERRL, lids and conjunctivae normal ENMT: Mucous membranes are moist. Posterior pharynx clear of any exudate or lesions. Neck: normal, supple, no masses Respiratory: clear to auscultation bilaterally, no wheezing, no crackles. Normal respiratory effort. Cardiovascular: bradycardic, distant heart sounds, no murmurs / rubs / gallops. No extremity edema. 2+ pedal pulses. Abdomen: no tenderness, no masses palpated.  Bowel sounds positive.  Musculoskeletal: no clubbing / cyanosis. No joint deformity upper and lower extremities. Good ROM, no contractures. Normal muscle tone. Reproducible musculoskeletal back pain, no spinous process TTP. Skin: no rashes, lesions, ulcers. No induration Neurologic: CN 2-12 grossly intact. Sensation intact, DTR normal. Strength 5/5 in all 4.  Psychiatric: Normal judgment and insight. Alert and oriented x 3. Normal mood.   Labs on Admission: I have personally reviewed following labs and imaging studies  CBC: Recent Labs  Lab 01/05/18 1121  WBC 8.5  HGB 14.5  HCT 44.5  MCV 92.9  PLT 253  Basic Metabolic Panel: Recent Labs  Lab 01/05/18 1121  NA 139  K 3.9  CL 104  CO2 26  GLUCOSE 111*  BUN 17  CREATININE 0.72  CALCIUM 9.8   GFR: Estimated Creatinine Clearance: 51.1 mL/min (by C-G formula based on SCr of 0.72 mg/dL). Liver Function Tests: No results for input(s): AST, ALT, ALKPHOS, BILITOT, PROT, ALBUMIN in the last 168 hours. No results for input(s): LIPASE, AMYLASE in the last 168 hours. No results for input(s): AMMONIA in the last 168 hours. Coagulation Profile: No results for input(s): INR, PROTIME in the last 168 hours. Cardiac Enzymes: No results for input(s): CKTOTAL, CKMB, CKMBINDEX, TROPONINI in the last 168 hours. BNP (last 3 results) No results for input(s): PROBNP in the last 8760 hours. HbA1C: No results for input(s): HGBA1C in the last 72 hours. CBG: No results for input(s): GLUCAP in the last 168 hours. Lipid Profile: No results for input(s): CHOL, HDL, LDLCALC, TRIG, CHOLHDL, LDLDIRECT in the last 72 hours. Thyroid Function Tests: No results for input(s): TSH, T4TOTAL, FREET4, T3FREE, THYROIDAB in the last 72 hours. Anemia Panel: No results for input(s): VITAMINB12, FOLATE, FERRITIN, TIBC, IRON, RETICCTPCT in the last 72 hours. Urine analysis: No results found for: COLORURINE, APPEARANCEUR, LABSPEC, PHURINE, GLUCOSEU, HGBUR,  BILIRUBINUR, KETONESUR, PROTEINUR, UROBILINOGEN, NITRITE, LEUKOCYTESUR  Radiological Exams on Admission: Dg Chest 2 View  Result Date: 01/05/2018 CLINICAL DATA:  Chest pain.  Hypertension EXAM: CHEST - 2 VIEW COMPARISON:  April 20, 2017 FINDINGS: There is stable scarring in the right middle lobe. There is no edema or consolidation. Heart size and pulmonary vascularity are normal. No adenopathy. There is degenerative change in the thoracic spine. IMPRESSION: Right middle lobe scarring. No edema or consolidation. No adenopathy evident. Electronically Signed   By: Lowella Grip III M.D.   On: 01/05/2018 11:53   Ct Angio Chest Aorta W/cm &/or Wo/cm  Result Date: 01/05/2018 CLINICAL DATA:  Chest pain.  Nausea for 3 days. EXAM: CT ANGIOGRAPHY CHEST WITH CONTRAST TECHNIQUE: Multidetector CT imaging of the chest was performed using the standard protocol during bolus administration of intravenous contrast. Multiplanar CT image reconstructions and MIPs were obtained to evaluate the vascular anatomy. CONTRAST:  141mL ISOVUE-370 IOPAMIDOL (ISOVUE-370) INJECTION 76% COMPARISON:  Plain film earlier today.  No prior CT. FINDINGS: Cardiovascular: The quality of this exam for evaluation of pulmonary embolism is good. No evidence of pulmonary embolism. Aortic atherosclerosis. Mild cardiomegaly, without pericardial effusion. Mediastinum/Nodes: No mediastinal or hilar adenopathy. Lungs/Pleura: No pleural fluid. Bibasilar dependent atelectasis and volume loss. Upper Abdomen: Contrast reflux into the IVC and hepatic veins, suggesting elevated right heart pressures. Normal imaged portions of the spleen, pancreas, adrenal glands, kidneys. Cholecystectomy. Apparent gastric wall thickening is at least partially due to underdistention. Musculoskeletal: Moderate midthoracic spondylosis. Review of the MIP images confirms the above findings. IMPRESSION: 1.  No evidence of pulmonary embolism. 2. Cardiomegaly. Contrast reflux into  the hepatic veins and IVC, suggesting elevated right heart pressures. 3.  Aortic Atherosclerosis (ICD10-I70.0). 4. Gastric underdistention. Apparent wall thickening is favored to be secondary. Correlate with any symptoms to suggest gastritis. Electronically Signed   By: Abigail Miyamoto M.D.   On: 01/05/2018 18:05    EKG: Independently reviewed. Sinus bradycardia. T wave flattening in V2.  Assessment/Plan Active Problems:   Chest pain  Epigastric discomfort with nausea Given pt's history of gastritis and gastric polyps, symptoms may be related to GERD. Doubt PUD as pain is not severe in epigastrium. Possible atypical anginal equivalent in an  older female with history of CVA, HTN and HLD. - Start PPI - GI cocktail x1 - Check H pylori - Trend delta trop - Monitor on telemetry - Elevate HOB - Would consider outpatient EGD  Musculoskeletal back pain Likely a result of remote fall. No concerning symptoms for cord compression present. - Conservative measures - Scheduled APAP - Can consider further imaging if pain not improving with conservative measures - PT eval  Sinus bradycardia, asymptomatic - Monitor on telemetry - Hold beta blockade - Obtain TTE - Continue ASA, statin - Check thyroid studies  Documented hypoxia - Pt not hypoxic or distressed during my encounter off supplemental O2 - Obtain TTE to evaluate cardiac function - Lasix x1 given in ED - Goal SpO2 >92%  SSTI of lower extremity, subsequent encounter - Continue Keflex, day 5 of 7 - Wound care per Nursing  Chronic medical conditions - HTN: continue amlodipine, losartan - HLD: continue PPI - Hypothyroidism: continue Synthroid  DVT prophylaxis: Lovenox Code Status: Full Disposition Plan: Home tomorrow Consults called: None Admission status: Tele, obs   Bennie Pierini MD [facility]  If 7PM-7AM, please contact night-coverage [email or web address] Password [facility]  01/05/2018, 8:32 PM

## 2018-01-05 NOTE — ED Notes (Signed)
Attempted report x1. 

## 2018-01-06 ENCOUNTER — Other Ambulatory Visit (HOSPITAL_COMMUNITY): Payer: Medicare Other

## 2018-01-06 ENCOUNTER — Observation Stay (HOSPITAL_BASED_OUTPATIENT_CLINIC_OR_DEPARTMENT_OTHER): Payer: Medicare Other

## 2018-01-06 DIAGNOSIS — I5030 Unspecified diastolic (congestive) heart failure: Secondary | ICD-10-CM

## 2018-01-06 DIAGNOSIS — R079 Chest pain, unspecified: Secondary | ICD-10-CM | POA: Diagnosis not present

## 2018-01-06 LAB — BASIC METABOLIC PANEL
Anion gap: 8 (ref 5–15)
BUN: 20 mg/dL (ref 6–20)
CALCIUM: 9.6 mg/dL (ref 8.9–10.3)
CO2: 29 mmol/L (ref 22–32)
CREATININE: 0.9 mg/dL (ref 0.44–1.00)
Chloride: 101 mmol/L (ref 101–111)
GFR calc Af Amer: >60 mL/min (ref >60)
GFR calc non Af Amer: 59 mL/min — ABNORMAL LOW (ref >60)
GLUCOSE: 89 mg/dL (ref 65–99)
Potassium: 4.9 mmol/L (ref 3.5–5.1)
Sodium: 138 mmol/L (ref 135–145)

## 2018-01-06 LAB — CBC
HCT: 42.4 % (ref 36.0–46.0)
Hemoglobin: 13.7 g/dL (ref 12.0–15.0)
MCH: 30 pg (ref 26.0–34.0)
MCHC: 32.3 g/dL (ref 30.0–36.0)
MCV: 93 fL (ref 78.0–100.0)
Platelets: 254 10*3/uL (ref 150–400)
RBC: 4.56 MIL/uL (ref 3.87–5.11)
RDW: 12.7 % (ref 11.5–15.5)
WBC: 8 10*3/uL (ref 4.0–10.5)

## 2018-01-06 LAB — ECHOCARDIOGRAM COMPLETE
Height: 63 in
Weight: 2305.6 oz

## 2018-01-06 MED ORDER — ONDANSETRON HCL 4 MG PO TABS: 4.0000 mg | ORAL_TABLET | Freq: Four times a day (QID) | ORAL | 0 refills | Status: DC

## 2018-01-06 MED ORDER — OXYCODONE HCL 5 MG PO TABS
5.0000 mg | ORAL_TABLET | Freq: Once | ORAL | Status: AC
  Administered 2018-01-06: 5 mg via ORAL
  Filled 2018-01-06: qty 1

## 2018-01-06 MED ORDER — PREDNISONE 20 MG PO TABS
20.0000 mg | ORAL_TABLET | Freq: Once | ORAL | Status: AC
  Administered 2018-01-06: 20 mg via ORAL
  Filled 2018-01-06: qty 1

## 2018-01-06 MED ORDER — OXYCODONE HCL 5 MG PO TABS: 5.0000 mg | ORAL_TABLET | Freq: Four times a day (QID) | ORAL | 0 refills | Status: AC | PRN

## 2018-01-06 MED ORDER — ACETAMINOPHEN-CODEINE #3 300-30 MG PO TABS
1.0000 | ORAL_TABLET | Freq: Once | ORAL | Status: AC
  Administered 2018-01-06: 1 via ORAL
  Filled 2018-01-06: qty 1

## 2018-01-06 NOTE — Progress Notes (Signed)
Patient complaining of progressive throbbing pain from her upper back that radiates to her bil arms and bil legs. Per patient her neurologist told her that she has a bulging disc going towards her spine. Verbalized this has been the worst pain ever. Patient  been moaning for pain at this time. MD paged made aware, awaiting to call back.

## 2018-01-06 NOTE — Evaluation (Signed)
Physical Therapy Evaluation Patient Details Name: Jasmine Smith MRN: [id number] DOB: 03-Mar-1936 Today's Date: 01/06/2018   History of Present Illness  Jasmine Smith is a 82 y.o. female with medical history significant for CVA, hypertension, hyperlipidemia and GERD who presented to the ED from home with 3 months of progressively worsening throbbing in her mid back and occasional nausea with epigastric discomfort.  Patient reports that the back pain started not long after her fall which she sustained in March with a right shoulder injury.    Clinical Impression  Patient evaluated by Physical Therapy with no further acute PT needs identified. All education has been completed and the patient has no further questions. PTA, pt living with husband in 1 level home independent with all mobility without AD. Upon eval pt presents with mid thoracic pain, and deconditioning reporting feeling weaker than baseline. Ambulating unit without AD or LOB, scored 20/24 on DGI dynamic balance assesment. (<19 fall risk). Recommending OP ortho PT to address back pain.  See below for any follow-up Physical Therapy or equipment needs. PT is signing off. Thank you for this referral.     Follow Up Recommendations Outpatient PT    Equipment Recommendations  None recommended by PT    Recommendations for Other Services       Precautions / Restrictions Precautions Precautions: None      Mobility  Bed Mobility Overal bed mobility: Modified Independent                Transfers Overall transfer level: Modified independent Equipment used: None                Ambulation/Gait Ambulation/Gait assistance: Supervision;Min guard Ambulation Distance (Feet): 350 Feet Assistive device: None Gait Pattern/deviations: Step-through pattern Gait velocity: decreased   General Gait Details: pt ambulating without AD or LOB  Stairs            Wheelchair Mobility    Modified Rankin (Stroke Patients Only)        Balance Overall balance assessment: Modified Independent                               Standardized Balance Assessment Standardized Balance Assessment : Dynamic Gait Index   Dynamic Gait Index Level Surface: Normal Change in Gait Speed: Normal Gait with Horizontal Head Turns: Normal Gait with Vertical Head Turns: Mild Impairment Gait and Pivot Turn: Mild Impairment Step Over Obstacle: Mild Impairment Step Around Obstacles: Normal Steps: Mild Impairment Total Score: 20       Pertinent Vitals/Pain Pain Assessment: 0-10 Pain Score: 7  Pain Location: mid back Pain Descriptors / Indicators: Discomfort;Throbbing Pain Intervention(s): Limited activity within patient's tolerance;Monitored during session;Premedicated before session;Repositioned    Home Living Family/patient expects to be discharged to:: Private residence Living Arrangements: Spouse/significant other Available Help at Discharge: Family;Available 24 hours/day Type of Home: House Home Access: Level entry     Home Layout: One level Home Equipment: None      Prior Function Level of Independence: Independent               Hand Dominance        Extremity/Trunk Assessment   Upper Extremity Assessment Upper Extremity Assessment: Overall WFL for tasks assessed    Lower Extremity Assessment Lower Extremity Assessment: Overall WFL for tasks assessed       Communication   Communication: No difficulties  Cognition Arousal/Alertness: Awake/alert Behavior During Therapy: WFL for tasks  assessed/performed Overall Cognitive Status: Within Functional Limits for tasks assessed                                        General Comments      Exercises     Assessment/Plan    PT Assessment All further PT needs can be met in the next venue of care  PT Problem List Pain       PT Treatment Interventions      PT Goals (Current goals can be found in the Care Plan  section)  Acute Rehab PT Goals Patient Stated Goal: less pain in back, go home with husband PT Goal Formulation: With patient/family Time For Goal Achievement: 01/20/18 Potential to Achieve Goals: Good    Frequency     Barriers to discharge        Co-evaluation               AM-PAC PT "6 Clicks" Daily Activity  Outcome Measure Difficulty turning over in bed (including adjusting bedclothes, sheets and blankets)?: A Little Difficulty moving from lying on back to sitting on the side of the bed? : A Little Difficulty sitting down on and standing up from a chair with arms (e.g., wheelchair, bedside commode, etc,.)?: A Little Help needed moving to and from a bed to chair (including a wheelchair)?: A Little Help needed walking in hospital room?: A Little Help needed climbing 3-5 steps with a railing? : A Little 6 Click Score: 18    End of Session Equipment Utilized During Treatment: Gait belt Activity Tolerance: Patient tolerated treatment well Patient left: in bed;with call bell/phone within reach;with family/visitor present Nurse Communication: Mobility status PT Visit Diagnosis: Pain Pain - part of body: (mid back)    Time: 2081-3887 PT Time Calculation (min) (ACUTE ONLY): 25 min   Charges:   PT Evaluation $PT Eval Low Complexity: 1 Low PT Treatments $Gait Training: 8-22 mins   PT G Codes:        Reinaldo Berber, PT, DPT Acute Rehab Services Pager: [phone]    Reinaldo Berber 01/06/2018, 11:25 AM

## 2018-01-06 NOTE — Progress Notes (Signed)
  Echocardiogram 2D Echocardiogram has been performed.  Jasmine Smith 01/06/2018, 11:22 AM

## 2018-01-06 NOTE — Progress Notes (Signed)
Discharge to home, husband at bedside. Pain level 3/10. D/c instructions and ff up appointments discussed with patient, verbalized understanding.

## 2018-01-06 NOTE — Plan of Care (Signed)
  Problem: Clinical Measurements: Goal: Ability to maintain clinical measurements within normal limits will improve Outcome: Progressing   Problem: Clinical Measurements: Goal: Diagnostic test results will improve Outcome: Progressing   Problem: Clinical Measurements: Goal: Cardiovascular complication will be avoided Outcome: Progressing   

## 2018-01-06 NOTE — Discharge Summary (Signed)
Physician Discharge Summary  Jasmine Smith DGU:[id number] DOB: 1935-12-21 DOA: 01/05/2018  PCP: Shirline Frees, MD  Admit date: 01/05/2018 Discharge date: 01/06/2018  Admitted From: Home Disposition: Home   Recommendations for Outpatient Follow-up:  1. Follow up with PCP in 1-2 weeks 2. Follow up with orthopedics, Dr. Berenice Primas, for further evaluation of back pain.  Home Health: None (outpatient PT referral) Equipment/Devices: None Discharge Condition: Stable CODE STATUS: Full Diet recommendation: Heart healthy  Brief/Interim Summary: Jasmine Smith is a 82 y.o. female with medical history significant for CVA, hypertension, hyperlipidemia and GERD who presented to the ED from home with 3 months of progressively worsening throbbing in her mid back and occasional nausea with epigastric discomfort.  Patient reports that the back pain started not long after her fall which she sustained in March with a right shoulder injury.  It has been worsening over that time and is now bothersome almost the entire day.  She has been taking Tylenol without significant relief of her symptoms.  She states that the pain is improved with heating pad.  She is unaware of any exacerbating symptoms.  She notes that on occasion she experiences a twinge of epigastric discomfort and nausea, without diaphoresis, shortness of breath, palpitations, vomiting.  She denies any worsening or improvement of her epigastric discomfort with food intake.  She has a known history of gastritis and has been taking Carafate twice daily.  There is no strong history of cardiovascular disease and patient is a non-smoker.  ED Course: In the ED, patient is afebrile, bradycardic into the 40s, normotensive and had a reported desaturation event to 88 and was thus placed on 2 L supplemental oxygen.  Labs notable for troponin of 0.012.  EKG shows sinus bradycardia with T wave flattening in lead V2, otherwise unremarkable.  Chest x-ray showed mild right  middle lobe scarring.  CTA chest, abdomen, pelvis showed no evidence for PE, dissection or aneurysm, contrast reflux into the IVC and hepatic veins suggesting elevated right-sided pressures, no acute lung or abdominopelvic abnormalities; possible gastric wall thickening noted.  Hospital Course:   Discharge Diagnoses:  Active Problems:   Chest pain  Musculoskeletal back pain: Suspect paraspinal spasm without radiculopathy.  - Improved with oxycodone, short prescription (< 5 days) provided. Will need orthopedics follow up. - Outpatient PT referral made - Continue movement, heating pad, etc.   Sinus bradycardia, asymptomatic: TSH 4.143. Echo without WMAs or LV dysfunction. - No further evaluation necessary. Continue to titrate beta blocker as needed.   Documented hypoxia: Resolved spontaneously.   SSTI of lower extremity, subsequent encounter - Continue Keflex as started as outpatient. No abscess noted. - Continue local wound care  Chronic medical conditions - HTN: continue amlodipine, losartan - HLD: continue PPI - Hypothyroidism: continue Synthroid  Discharge Instructions Discharge Instructions    Ambulatory referral to Physical Therapy   Complete by:  As directed    Referral to PT at [facility] for thoracic spondylosis.   For [facility], verify address and phone number.  [place] will contact family to schedule appointment.   Iontophoresis - 4 mg/ml of dexamethasone:  No   T.E.N.S. Unit Evaluation and Dispense as Indicated:  No   Diet - low sodium heart healthy   Complete by:  As directed    Discharge instructions   Complete by:  As directed    You were evaluated for pain to ensure your heart was not the cause. The evaluation was negative, meaning the heart is  not causing your symptoms. You are stable for discharge with the following recommendations:  - Hold metoprolol until you follow up with your PCP. It may be making your heart rate too slow. Otherwise,  continue medications as you were.  - Take tylenol #3 for back pain and follow up with Dr. Berenice Primas.   Increase activity slowly   Complete by:  As directed      Allergies as of 01/06/2018      Reactions   Augmentin [amoxicillin-pot Clavulanate] Other (See Comments)   Abdominal pain   Clarithromycin    Levaquin  [levofloxacin In D5w]    Levofloxacin    Poor Indigestion    Sulfa Antibiotics    Sulfa Drugs Cross Reactors       Medication List    STOP taking these medications   metoprolol succinate 25 MG 24 hr tablet Commonly known as:  TOPROL-XL     TAKE these medications   amLODipine 5 MG tablet Commonly known as:  NORVASC Take 5 mg by mouth daily.   aspirin 81 MG tablet Take 162 mg by mouth daily.   Biotin 5000 MCG Caps Take 1 capsule by mouth daily.   CITRUCEL PO Take by mouth. 2 tablespoons daily   KRILL OIL PO Take 1 capsule by mouth daily.   levothyroxine 25 MCG tablet Commonly known as:  SYNTHROID, LEVOTHROID Take 25 mcg by mouth daily.   LORazepam 0.5 MG tablet Commonly known as:  ATIVAN Take 0.5 mg by mouth 2 (two) times daily.   losartan 100 MG tablet Commonly known as:  COZAAR Take 100 mg by mouth daily.   multivitamin tablet Take 1 tablet by mouth daily.   ondansetron 4 MG tablet Commonly known as:  ZOFRAN Take 1 tablet (4 mg total) by mouth every 6 (six) hours.   oxyCODONE 5 MG immediate release tablet Commonly known as:  Oxy IR/ROXICODONE Take 1 tablet (5 mg total) by mouth every 6 (six) hours as needed for up to 5 days for severe pain.   simvastatin 20 MG tablet Commonly known as:  ZOCOR Take 20 mg by mouth daily.   sucralfate 1 g tablet Commonly known as:  CARAFATE Take 1 g by mouth 2 (two) times daily.      Follow-up Information    Shirline Frees, MD. Schedule an appointment as soon as possible for a visit on 01/15/2018.   Specialty:  Family Medicine Why:  @ 12:00pm Contact information: [place]  [address] [phone]        Dorna Leitz, MD.   Specialty:  Orthopedic Surgery Why:  for follow up on right shoulder and back pain Contact information: [place] [address] [phone]          Allergies  Allergen Reactions  . Augmentin [Amoxicillin-Pot Clavulanate] Other (See Comments)    Abdominal pain  . Clarithromycin   . Levaquin  [Levofloxacin In D5w]   . Levofloxacin     Poor Indigestion   . Sulfa Antibiotics   . Sulfa Drugs Cross Reactors     Consultations:  None  Procedures/Studies: Dg Chest 2 View  Result Date: 01/05/2018 CLINICAL DATA:  Chest pain.  Hypertension EXAM: CHEST - 2 VIEW COMPARISON:  April 20, 2017 FINDINGS: There is stable scarring in the right middle lobe. There is no edema or consolidation. Heart size and pulmonary vascularity are normal. No adenopathy. There is degenerative change in the thoracic spine. IMPRESSION: Right middle lobe scarring. No  edema or consolidation. No adenopathy evident. Electronically Signed   By: Lowella Grip III M.D.   On: 01/05/2018 11:53   Ct Abdomen Pelvis W Contrast  Result Date: 12/31/2017 CLINICAL DATA:  Epigastric pain for several months EXAM: CT ABDOMEN AND PELVIS WITH CONTRAST TECHNIQUE: Multidetector CT imaging of the abdomen and pelvis was performed using the standard protocol following bolus administration of intravenous contrast. CONTRAST:  153mL ISOVUE-300 IOPAMIDOL (ISOVUE-300) INJECTION 61% COMPARISON:  06/28/2015 FINDINGS: Lower chest: Some scarring is noted in the right middle lobe stable from the prior exam. Hepatobiliary: Gallbladder has been surgically removed. The liver is within normal limits. No biliary abnormality is seen. Pancreas: Unremarkable. No pancreatic ductal dilatation or surrounding inflammatory changes. Spleen: Normal in size without focal abnormality. Adrenals/Urinary Tract: Adrenal glands are unremarkable. Kidneys are normal, without renal calculi, focal lesion, or  hydronephrosis. Bladder is unremarkable. Stomach/Bowel: Diffuse diverticulosis is noted of the colon similar to that seen on the prior exam. Again no changes to suggest diverticulitis are seen. The appendix has been surgically removed. No small bowel abnormality is seen. Vascular/Lymphatic: Aortic atherosclerosis. No enlarged abdominal or pelvic lymph nodes. Reproductive: Status post hysterectomy. No adnexal masses. Other: No abdominal wall hernia or abnormality. No abdominopelvic ascites. Musculoskeletal: Degenerative changes of the lumbar spine are noted. IMPRESSION: Diverticulosis without diverticulitis. Postoperative changes without acute abnormality. Electronically Signed   By: Inez Catalina M.D.   On: 12/31/2017 08:25   Ct Angio Chest Aorta W/cm &/or Wo/cm  Result Date: 01/05/2018 CLINICAL DATA:  Chest pain.  Nausea for 3 days. EXAM: CT ANGIOGRAPHY CHEST WITH CONTRAST TECHNIQUE: Multidetector CT imaging of the chest was performed using the standard protocol during bolus administration of intravenous contrast. Multiplanar CT image reconstructions and MIPs were obtained to evaluate the vascular anatomy. CONTRAST:  174mL ISOVUE-370 IOPAMIDOL (ISOVUE-370) INJECTION 76% COMPARISON:  Plain film earlier today.  No prior CT. FINDINGS: Cardiovascular: The quality of this exam for evaluation of pulmonary embolism is good. No evidence of pulmonary embolism. Aortic atherosclerosis. Mild cardiomegaly, without pericardial effusion. Mediastinum/Nodes: No mediastinal or hilar adenopathy. Lungs/Pleura: No pleural fluid. Bibasilar dependent atelectasis and volume loss. Upper Abdomen: Contrast reflux into the IVC and hepatic veins, suggesting elevated right heart pressures. Normal imaged portions of the spleen, pancreas, adrenal glands, kidneys. Cholecystectomy. Apparent gastric wall thickening is at least partially due to underdistention. Musculoskeletal: Moderate midthoracic spondylosis. Review of the MIP images confirms  the above findings. IMPRESSION: 1.  No evidence of pulmonary embolism. 2. Cardiomegaly. Contrast reflux into the hepatic veins and IVC, suggesting elevated right heart pressures. 3.  Aortic Atherosclerosis (ICD10-I70.0). 4. Gastric underdistention. Apparent wall thickening is favored to be secondary. Correlate with any symptoms to suggest gastritis. Electronically Signed   By: Abigail Miyamoto M.D.   On: 01/05/2018 18:05   Echocardiogram 01/06/2018: - Left ventricle: The cavity size was normal. Systolic function was   normal. The estimated ejection fraction was in the range of 55%   to 60%. Wall motion was normal; there were no regional wall   motion abnormalities. Doppler parameters are consistent with   abnormal left ventricular relaxation (grade 1 diastolic   dysfunction).  Subjective: Back pain improved significantly with medications. No numbness, weakness. Ambulated with PT for >10 minutes.   Discharge Exam: Vitals:   01/06/18 0828 01/06/18 1143  BP: 140/60 (!) 116/52  Pulse: (!) 57 60  Resp: 14 20  Temp:  98.2 F (36.8 C)  SpO2: 100% 97%   General: Pt is alert, awake, not  in acute distress Cardiovascular: RRR, S1/S2 +, no rubs, no gallops Respiratory: CTA bilaterally, no wheezing, no rhonchi Abdominal: Soft, NT, ND, bowel sounds + Extremities: No edema, no cyanosis MSK: Paraspinal spasm bilateral thoracolumbar areas without palpable spinal deformity. Neuro: Negative SLR. A&O. No focal deficits in sensation or motor function of lower extremities.  Labs: BNP (last 3 results) Recent Labs    01/05/18 2047  BNP 10.2   Basic Metabolic Panel: Recent Labs  Lab 01/05/18 1121 01/05/18 2047 01/06/18 0635  NA 139  --  138  K 3.9  --  4.9  CL 104  --  101  CO2 26  --  29  GLUCOSE 111*  --  89  BUN 17  --  20  CREATININE 0.72  --  0.90  CALCIUM 9.8  --  9.6  MG  --  2.1  --   PHOS  --  3.8  --    Liver Function Tests: Recent Labs  Lab 01/05/18 2047  AST 19  ALT 20   ALKPHOS 56  BILITOT 1.1  PROT 7.0  ALBUMIN 3.9   No results for input(s): LIPASE, AMYLASE in the last 168 hours. No results for input(s): AMMONIA in the last 168 hours. CBC: Recent Labs  Lab 01/05/18 1121 01/06/18 0635  WBC 8.5 8.0  HGB 14.5 13.7  HCT 44.5 42.4  MCV 92.9 93.0  PLT 253 254   Cardiac Enzymes: Recent Labs  Lab 01/05/18 2047  TROPONINI <0.03   BNP: Invalid input(s): POCBNP CBG: No results for input(s): GLUCAP in the last 168 hours. D-Dimer No results for input(s): DDIMER in the last 72 hours. Hgb A1c Recent Labs    01/05/18 2047  HGBA1C 5.8*   Lipid Profile Recent Labs    01/05/18 2047  CHOL 167  HDL 67  LDLCALC 78  TRIG 110  CHOLHDL 2.5   Thyroid function studies Recent Labs    01/05/18 2047  TSH 4.143   Anemia work up No results for input(s): VITAMINB12, FOLATE, FERRITIN, TIBC, IRON, RETICCTPCT in the last 72 hours. Urinalysis No results found for: COLORURINE, APPEARANCEUR, LABSPEC, [place], GLUCOSEU, HGBUR, BILIRUBINUR, KETONESUR, PROTEINUR, UROBILINOGEN, NITRITE, [place]  Microbiology No results found for this or any previous visit (from the past 240 hour(s)).  Time coordinating discharge: Approximately 40 minutes  Patrecia Pour, MD  [facility] 01/08/2018, 7:44 PM Pager [phone]

## 2018-01-07 LAB — H. PYLORI ANTIBODY, IGG: H Pylori IgG: <0.8 Index Value (ref 0.00–0.79)

## 2018-01-13 ENCOUNTER — Ambulatory Visit
Admission: RE | Admit: 2018-01-13 | Discharge: 2018-01-13 | Disposition: A | Discharge status: Home / Self Care | Payer: Medicare Other | Source: Ambulatory Visit | Attending: Family Medicine | Admitting: Family Medicine

## 2018-01-13 DIAGNOSIS — Z1231 Encounter for screening mammogram for malignant neoplasm of breast: Secondary | ICD-10-CM

## 2018-01-15 DIAGNOSIS — E039 Hypothyroidism, unspecified: Secondary | ICD-10-CM | POA: Diagnosis not present

## 2018-01-15 DIAGNOSIS — R7303 Prediabetes: Secondary | ICD-10-CM | POA: Diagnosis not present

## 2018-01-15 DIAGNOSIS — E7800 Pure hypercholesterolemia, unspecified: Secondary | ICD-10-CM | POA: Diagnosis not present

## 2018-01-15 DIAGNOSIS — I10 Essential (primary) hypertension: Secondary | ICD-10-CM | POA: Diagnosis not present

## 2018-01-18 DIAGNOSIS — M67911 Unspecified disorder of synovium and tendon, right shoulder: Secondary | ICD-10-CM | POA: Diagnosis not present

## 2018-02-04 DIAGNOSIS — M546 Pain in thoracic spine: Secondary | ICD-10-CM | POA: Diagnosis not present

## 2018-02-05 DIAGNOSIS — M546 Pain in thoracic spine: Secondary | ICD-10-CM | POA: Diagnosis not present

## 2018-02-12 ENCOUNTER — Other Ambulatory Visit: Payer: Self-pay | Admitting: Orthopedic Surgery

## 2018-02-12 DIAGNOSIS — M4800 Spinal stenosis, site unspecified: Secondary | ICD-10-CM

## 2018-02-14 ENCOUNTER — Other Ambulatory Visit: Payer: Medicare Other

## 2018-02-18 ENCOUNTER — Ambulatory Visit
Admission: RE | Admit: 2018-02-18 | Discharge: 2018-02-18 | Disposition: A | Discharge status: Home / Self Care | Payer: Medicare Other | Source: Ambulatory Visit | Attending: Orthopedic Surgery | Admitting: Orthopedic Surgery

## 2018-02-18 ENCOUNTER — Other Ambulatory Visit: Payer: Medicare Other

## 2018-02-18 DIAGNOSIS — M4800 Spinal stenosis, site unspecified: Secondary | ICD-10-CM

## 2018-02-18 DIAGNOSIS — M5126 Other intervertebral disc displacement, lumbar region: Secondary | ICD-10-CM | POA: Diagnosis not present

## 2018-02-18 DIAGNOSIS — M48061 Spinal stenosis, lumbar region without neurogenic claudication: Secondary | ICD-10-CM | POA: Diagnosis not present

## 2018-02-18 DIAGNOSIS — M5124 Other intervertebral disc displacement, thoracic region: Secondary | ICD-10-CM | POA: Diagnosis not present

## 2018-02-22 DIAGNOSIS — M545 Low back pain: Secondary | ICD-10-CM | POA: Diagnosis not present

## 2018-02-22 DIAGNOSIS — M546 Pain in thoracic spine: Secondary | ICD-10-CM | POA: Diagnosis not present

## 2018-03-10 DIAGNOSIS — N3946 Mixed incontinence: Secondary | ICD-10-CM | POA: Diagnosis not present

## 2018-03-10 DIAGNOSIS — G629 Polyneuropathy, unspecified: Secondary | ICD-10-CM | POA: Diagnosis not present

## 2018-03-10 DIAGNOSIS — K5909 Other constipation: Secondary | ICD-10-CM | POA: Diagnosis not present

## 2018-04-07 DIAGNOSIS — J019 Acute sinusitis, unspecified: Secondary | ICD-10-CM | POA: Diagnosis not present

## 2018-04-13 DIAGNOSIS — J0100 Acute maxillary sinusitis, unspecified: Secondary | ICD-10-CM | POA: Diagnosis not present

## 2018-04-13 DIAGNOSIS — I10 Essential (primary) hypertension: Secondary | ICD-10-CM | POA: Diagnosis not present

## 2018-04-13 DIAGNOSIS — G629 Polyneuropathy, unspecified: Secondary | ICD-10-CM | POA: Diagnosis not present

## 2018-04-13 DIAGNOSIS — R7303 Prediabetes: Secondary | ICD-10-CM | POA: Diagnosis not present

## 2018-04-19 DIAGNOSIS — H5213 Myopia, bilateral: Secondary | ICD-10-CM | POA: Diagnosis not present

## 2018-07-02 DIAGNOSIS — J069 Acute upper respiratory infection, unspecified: Secondary | ICD-10-CM | POA: Diagnosis not present

## 2018-07-13 DIAGNOSIS — J069 Acute upper respiratory infection, unspecified: Secondary | ICD-10-CM | POA: Diagnosis not present

## 2018-07-14 DIAGNOSIS — L821 Other seborrheic keratosis: Secondary | ICD-10-CM | POA: Diagnosis not present

## 2018-07-14 DIAGNOSIS — D485 Neoplasm of uncertain behavior of skin: Secondary | ICD-10-CM | POA: Diagnosis not present

## 2018-08-09 DIAGNOSIS — Z0000 Encounter for general adult medical examination without abnormal findings: Secondary | ICD-10-CM | POA: Diagnosis not present

## 2018-08-09 DIAGNOSIS — E039 Hypothyroidism, unspecified: Secondary | ICD-10-CM | POA: Diagnosis not present

## 2018-08-09 DIAGNOSIS — I10 Essential (primary) hypertension: Secondary | ICD-10-CM | POA: Diagnosis not present

## 2018-08-09 DIAGNOSIS — E7800 Pure hypercholesterolemia, unspecified: Secondary | ICD-10-CM | POA: Diagnosis not present

## 2018-09-27 DIAGNOSIS — R509 Fever, unspecified: Secondary | ICD-10-CM | POA: Diagnosis not present

## 2018-09-27 DIAGNOSIS — J029 Acute pharyngitis, unspecified: Secondary | ICD-10-CM | POA: Diagnosis not present

## 2019-01-31 ENCOUNTER — Other Ambulatory Visit: Payer: Self-pay | Admitting: Family Medicine

## 2019-01-31 DIAGNOSIS — Z1231 Encounter for screening mammogram for malignant neoplasm of breast: Secondary | ICD-10-CM

## 2019-02-11 DIAGNOSIS — F419 Anxiety disorder, unspecified: Secondary | ICD-10-CM | POA: Diagnosis not present

## 2019-02-11 DIAGNOSIS — E7800 Pure hypercholesterolemia, unspecified: Secondary | ICD-10-CM | POA: Diagnosis not present

## 2019-02-11 DIAGNOSIS — E039 Hypothyroidism, unspecified: Secondary | ICD-10-CM | POA: Diagnosis not present

## 2019-02-11 DIAGNOSIS — J209 Acute bronchitis, unspecified: Secondary | ICD-10-CM | POA: Diagnosis not present

## 2019-03-16 ENCOUNTER — Other Ambulatory Visit: Payer: Self-pay

## 2019-03-16 ENCOUNTER — Ambulatory Visit
Admission: RE | Admit: 2019-03-16 | Discharge: 2019-03-16 | Disposition: A | Discharge status: Home / Self Care | Payer: Medicare Other | Source: Ambulatory Visit | Attending: Family Medicine | Admitting: Family Medicine

## 2019-03-16 DIAGNOSIS — Z1231 Encounter for screening mammogram for malignant neoplasm of breast: Secondary | ICD-10-CM

## 2019-03-24 DIAGNOSIS — Z23 Encounter for immunization: Secondary | ICD-10-CM | POA: Diagnosis not present

## 2019-04-22 DIAGNOSIS — H5213 Myopia, bilateral: Secondary | ICD-10-CM | POA: Diagnosis not present

## 2019-05-26 DIAGNOSIS — I10 Essential (primary) hypertension: Secondary | ICD-10-CM | POA: Diagnosis not present

## 2019-05-26 DIAGNOSIS — M1990 Unspecified osteoarthritis, unspecified site: Secondary | ICD-10-CM | POA: Diagnosis not present

## 2019-05-26 DIAGNOSIS — E039 Hypothyroidism, unspecified: Secondary | ICD-10-CM | POA: Diagnosis not present

## 2019-05-26 DIAGNOSIS — M8580 Other specified disorders of bone density and structure, unspecified site: Secondary | ICD-10-CM | POA: Diagnosis not present

## 2019-06-14 DIAGNOSIS — E7800 Pure hypercholesterolemia, unspecified: Secondary | ICD-10-CM | POA: Diagnosis not present

## 2019-06-14 DIAGNOSIS — E039 Hypothyroidism, unspecified: Secondary | ICD-10-CM | POA: Diagnosis not present

## 2019-06-14 DIAGNOSIS — I10 Essential (primary) hypertension: Secondary | ICD-10-CM | POA: Diagnosis not present

## 2019-06-14 DIAGNOSIS — M8580 Other specified disorders of bone density and structure, unspecified site: Secondary | ICD-10-CM | POA: Diagnosis not present

## 2019-07-28 DIAGNOSIS — M1990 Unspecified osteoarthritis, unspecified site: Secondary | ICD-10-CM | POA: Diagnosis not present

## 2019-07-28 DIAGNOSIS — M8580 Other specified disorders of bone density and structure, unspecified site: Secondary | ICD-10-CM | POA: Diagnosis not present

## 2019-07-28 DIAGNOSIS — E039 Hypothyroidism, unspecified: Secondary | ICD-10-CM | POA: Diagnosis not present

## 2019-07-28 DIAGNOSIS — I10 Essential (primary) hypertension: Secondary | ICD-10-CM | POA: Diagnosis not present

## 2019-08-02 DIAGNOSIS — H40023 Open angle with borderline findings, high risk, bilateral: Secondary | ICD-10-CM | POA: Diagnosis not present

## 2019-08-02 DIAGNOSIS — H04123 Dry eye syndrome of bilateral lacrimal glands: Secondary | ICD-10-CM | POA: Diagnosis not present

## 2019-08-11 DIAGNOSIS — I10 Essential (primary) hypertension: Secondary | ICD-10-CM | POA: Diagnosis not present

## 2019-08-16 DIAGNOSIS — I10 Essential (primary) hypertension: Secondary | ICD-10-CM | POA: Diagnosis not present

## 2019-08-16 DIAGNOSIS — F419 Anxiety disorder, unspecified: Secondary | ICD-10-CM | POA: Diagnosis not present

## 2019-08-16 DIAGNOSIS — Z0000 Encounter for general adult medical examination without abnormal findings: Secondary | ICD-10-CM | POA: Diagnosis not present

## 2019-08-16 DIAGNOSIS — R7303 Prediabetes: Secondary | ICD-10-CM | POA: Diagnosis not present

## 2019-08-17 ENCOUNTER — Ambulatory Visit: Payer: Medicare Other | Attending: Internal Medicine

## 2019-08-17 DIAGNOSIS — Z23 Encounter for immunization: Secondary | ICD-10-CM | POA: Insufficient documentation

## 2019-08-17 NOTE — Progress Notes (Signed)
   Covid-19 Vaccination Clinic  Name:  Jasmine Smith    MRN: [id number] DOB: 02-18-1936  08/17/2019  [name] was observed post Covid-19 immunization for 15 minutes without incidence. She was provided with Vaccine Information Sheet and instruction to access the V-Safe system.   [name] was instructed to call 911 with any severe reactions post vaccine: Marland Kitchen Difficulty breathing  . Swelling of your face and throat  . A fast heartbeat  . A bad rash all over your body  . Dizziness and weakness    Immunizations Administered    Name Date Dose VIS Date Route   Pfizer COVID-19 Vaccine 08/17/2019  1:27 PM 0.3 mL 07/08/2019 Intramuscular   Manufacturer: [place]   Lot: [id number]   [place]: [id number]

## 2019-08-23 ENCOUNTER — Other Ambulatory Visit: Payer: Self-pay | Admitting: Family Medicine

## 2019-08-23 DIAGNOSIS — Z1231 Encounter for screening mammogram for malignant neoplasm of breast: Secondary | ICD-10-CM

## 2019-08-23 DIAGNOSIS — M8580 Other specified disorders of bone density and structure, unspecified site: Secondary | ICD-10-CM

## 2019-08-24 DIAGNOSIS — I10 Essential (primary) hypertension: Secondary | ICD-10-CM | POA: Diagnosis not present

## 2019-08-24 DIAGNOSIS — M8580 Other specified disorders of bone density and structure, unspecified site: Secondary | ICD-10-CM | POA: Diagnosis not present

## 2019-08-24 DIAGNOSIS — M1990 Unspecified osteoarthritis, unspecified site: Secondary | ICD-10-CM | POA: Diagnosis not present

## 2019-08-24 DIAGNOSIS — E039 Hypothyroidism, unspecified: Secondary | ICD-10-CM | POA: Diagnosis not present

## 2019-09-05 ENCOUNTER — Ambulatory Visit: Payer: Medicare Other | Attending: Internal Medicine

## 2019-09-05 DIAGNOSIS — Z23 Encounter for immunization: Secondary | ICD-10-CM | POA: Insufficient documentation

## 2019-09-05 NOTE — Progress Notes (Signed)
   Covid-19 Vaccination Clinic  Name:  Jasmine Smith    MRN: [id number] DOB: 13-Apr-1936  09/05/2019  Ms. Jasmine Smith was observed post Covid-19 immunization for 15 minutes without incidence. She was provided with Vaccine Information Sheet and instruction to access the V-Safe system.   Ms. Jasmine Smith was instructed to call 911 with any severe reactions post vaccine: Marland Kitchen Difficulty breathing  . Swelling of your face and throat  . A fast heartbeat  . A bad rash all over your body  . Dizziness and weakness    Immunizations Administered    Name Date Dose VIS Date Route   Pfizer COVID-19 Vaccine 09/05/2019 11:00 AM 0.3 mL 07/08/2019 Intramuscular   Manufacturer: [place]   Lot: [id number]   [place]: [id number]

## 2019-09-20 DIAGNOSIS — R7303 Prediabetes: Secondary | ICD-10-CM | POA: Diagnosis not present

## 2019-09-20 DIAGNOSIS — I10 Essential (primary) hypertension: Secondary | ICD-10-CM | POA: Diagnosis not present

## 2019-09-20 DIAGNOSIS — E7800 Pure hypercholesterolemia, unspecified: Secondary | ICD-10-CM | POA: Diagnosis not present

## 2019-09-23 DIAGNOSIS — M1990 Unspecified osteoarthritis, unspecified site: Secondary | ICD-10-CM | POA: Diagnosis not present

## 2019-09-23 DIAGNOSIS — E039 Hypothyroidism, unspecified: Secondary | ICD-10-CM | POA: Diagnosis not present

## 2019-09-23 DIAGNOSIS — M8580 Other specified disorders of bone density and structure, unspecified site: Secondary | ICD-10-CM | POA: Diagnosis not present

## 2019-09-23 DIAGNOSIS — I10 Essential (primary) hypertension: Secondary | ICD-10-CM | POA: Diagnosis not present

## 2019-09-26 DIAGNOSIS — Z0120 Encounter for dental examination and cleaning without abnormal findings: Secondary | ICD-10-CM | POA: Diagnosis not present

## 2019-10-03 DIAGNOSIS — N3000 Acute cystitis without hematuria: Secondary | ICD-10-CM | POA: Diagnosis not present

## 2019-10-03 DIAGNOSIS — R300 Dysuria: Secondary | ICD-10-CM | POA: Diagnosis not present

## 2019-10-06 DIAGNOSIS — H401111 Primary open-angle glaucoma, right eye, mild stage: Secondary | ICD-10-CM | POA: Diagnosis not present

## 2019-10-06 DIAGNOSIS — H401123 Primary open-angle glaucoma, left eye, severe stage: Secondary | ICD-10-CM | POA: Diagnosis not present

## 2019-10-14 DIAGNOSIS — N390 Urinary tract infection, site not specified: Secondary | ICD-10-CM | POA: Diagnosis not present

## 2019-10-18 DIAGNOSIS — Z0120 Encounter for dental examination and cleaning without abnormal findings: Secondary | ICD-10-CM | POA: Diagnosis not present

## 2019-10-21 DIAGNOSIS — E039 Hypothyroidism, unspecified: Secondary | ICD-10-CM | POA: Diagnosis not present

## 2019-10-21 DIAGNOSIS — M8580 Other specified disorders of bone density and structure, unspecified site: Secondary | ICD-10-CM | POA: Diagnosis not present

## 2019-10-21 DIAGNOSIS — M1990 Unspecified osteoarthritis, unspecified site: Secondary | ICD-10-CM | POA: Diagnosis not present

## 2019-10-21 DIAGNOSIS — I10 Essential (primary) hypertension: Secondary | ICD-10-CM | POA: Diagnosis not present

## 2019-10-25 DIAGNOSIS — I10 Essential (primary) hypertension: Secondary | ICD-10-CM | POA: Diagnosis not present

## 2019-11-07 DIAGNOSIS — H401123 Primary open-angle glaucoma, left eye, severe stage: Secondary | ICD-10-CM | POA: Diagnosis not present

## 2019-11-07 DIAGNOSIS — H401111 Primary open-angle glaucoma, right eye, mild stage: Secondary | ICD-10-CM | POA: Diagnosis not present

## 2019-11-10 DIAGNOSIS — H40023 Open angle with borderline findings, high risk, bilateral: Secondary | ICD-10-CM | POA: Diagnosis not present

## 2019-11-10 DIAGNOSIS — H04123 Dry eye syndrome of bilateral lacrimal glands: Secondary | ICD-10-CM | POA: Diagnosis not present

## 2019-11-14 DIAGNOSIS — R300 Dysuria: Secondary | ICD-10-CM | POA: Diagnosis not present

## 2019-11-14 DIAGNOSIS — R3129 Other microscopic hematuria: Secondary | ICD-10-CM | POA: Diagnosis not present

## 2019-12-12 DIAGNOSIS — H401123 Primary open-angle glaucoma, left eye, severe stage: Secondary | ICD-10-CM | POA: Diagnosis not present

## 2019-12-12 DIAGNOSIS — H401111 Primary open-angle glaucoma, right eye, mild stage: Secondary | ICD-10-CM | POA: Diagnosis not present

## 2019-12-22 DIAGNOSIS — E039 Hypothyroidism, unspecified: Secondary | ICD-10-CM | POA: Diagnosis not present

## 2019-12-23 DIAGNOSIS — M8580 Other specified disorders of bone density and structure, unspecified site: Secondary | ICD-10-CM | POA: Diagnosis not present

## 2019-12-23 DIAGNOSIS — E039 Hypothyroidism, unspecified: Secondary | ICD-10-CM | POA: Diagnosis not present

## 2019-12-23 DIAGNOSIS — I10 Essential (primary) hypertension: Secondary | ICD-10-CM | POA: Diagnosis not present

## 2019-12-23 DIAGNOSIS — M1990 Unspecified osteoarthritis, unspecified site: Secondary | ICD-10-CM | POA: Diagnosis not present

## 2020-01-05 DIAGNOSIS — N3946 Mixed incontinence: Secondary | ICD-10-CM | POA: Diagnosis not present

## 2020-01-05 DIAGNOSIS — R3121 Asymptomatic microscopic hematuria: Secondary | ICD-10-CM | POA: Diagnosis not present

## 2020-01-11 DIAGNOSIS — K5730 Diverticulosis of large intestine without perforation or abscess without bleeding: Secondary | ICD-10-CM | POA: Diagnosis not present

## 2020-01-11 DIAGNOSIS — R3121 Asymptomatic microscopic hematuria: Secondary | ICD-10-CM | POA: Diagnosis not present

## 2020-01-11 DIAGNOSIS — R3129 Other microscopic hematuria: Secondary | ICD-10-CM | POA: Diagnosis not present

## 2020-01-23 DIAGNOSIS — E7800 Pure hypercholesterolemia, unspecified: Secondary | ICD-10-CM | POA: Diagnosis not present

## 2020-01-23 DIAGNOSIS — E039 Hypothyroidism, unspecified: Secondary | ICD-10-CM | POA: Diagnosis not present

## 2020-01-23 DIAGNOSIS — I10 Essential (primary) hypertension: Secondary | ICD-10-CM | POA: Diagnosis not present

## 2020-01-23 DIAGNOSIS — M1990 Unspecified osteoarthritis, unspecified site: Secondary | ICD-10-CM | POA: Diagnosis not present

## 2020-01-24 DIAGNOSIS — R3121 Asymptomatic microscopic hematuria: Secondary | ICD-10-CM | POA: Diagnosis not present

## 2020-01-24 DIAGNOSIS — N3946 Mixed incontinence: Secondary | ICD-10-CM | POA: Diagnosis not present

## 2020-01-24 DIAGNOSIS — R350 Frequency of micturition: Secondary | ICD-10-CM | POA: Diagnosis not present

## 2020-01-24 DIAGNOSIS — R309 Painful micturition, unspecified: Secondary | ICD-10-CM | POA: Diagnosis not present

## 2020-01-25 DIAGNOSIS — E7800 Pure hypercholesterolemia, unspecified: Secondary | ICD-10-CM | POA: Diagnosis not present

## 2020-02-23 DIAGNOSIS — M1990 Unspecified osteoarthritis, unspecified site: Secondary | ICD-10-CM | POA: Diagnosis not present

## 2020-02-23 DIAGNOSIS — E039 Hypothyroidism, unspecified: Secondary | ICD-10-CM | POA: Diagnosis not present

## 2020-02-23 DIAGNOSIS — E7800 Pure hypercholesterolemia, unspecified: Secondary | ICD-10-CM | POA: Diagnosis not present

## 2020-02-23 DIAGNOSIS — I10 Essential (primary) hypertension: Secondary | ICD-10-CM | POA: Diagnosis not present

## 2020-03-16 ENCOUNTER — Ambulatory Visit
Admission: RE | Admit: 2020-03-16 | Discharge: 2020-03-16 | Disposition: A | Discharge status: Home / Self Care | Payer: Medicare Other | Source: Ambulatory Visit | Attending: Family Medicine | Admitting: Family Medicine

## 2020-03-16 ENCOUNTER — Other Ambulatory Visit: Payer: Self-pay

## 2020-03-16 DIAGNOSIS — M85852 Other specified disorders of bone density and structure, left thigh: Secondary | ICD-10-CM | POA: Diagnosis not present

## 2020-03-16 DIAGNOSIS — Z1231 Encounter for screening mammogram for malignant neoplasm of breast: Secondary | ICD-10-CM

## 2020-03-16 DIAGNOSIS — Z780 Asymptomatic menopausal state: Secondary | ICD-10-CM | POA: Diagnosis not present

## 2020-03-16 DIAGNOSIS — M8580 Other specified disorders of bone density and structure, unspecified site: Secondary | ICD-10-CM

## 2020-03-27 DIAGNOSIS — E039 Hypothyroidism, unspecified: Secondary | ICD-10-CM | POA: Diagnosis not present

## 2020-03-27 DIAGNOSIS — M1990 Unspecified osteoarthritis, unspecified site: Secondary | ICD-10-CM | POA: Diagnosis not present

## 2020-03-27 DIAGNOSIS — I10 Essential (primary) hypertension: Secondary | ICD-10-CM | POA: Diagnosis not present

## 2020-03-27 DIAGNOSIS — E7800 Pure hypercholesterolemia, unspecified: Secondary | ICD-10-CM | POA: Diagnosis not present

## 2020-04-16 DIAGNOSIS — H401111 Primary open-angle glaucoma, right eye, mild stage: Secondary | ICD-10-CM | POA: Diagnosis not present

## 2020-04-16 DIAGNOSIS — H401123 Primary open-angle glaucoma, left eye, severe stage: Secondary | ICD-10-CM | POA: Diagnosis not present

## 2020-04-16 DIAGNOSIS — E7800 Pure hypercholesterolemia, unspecified: Secondary | ICD-10-CM | POA: Diagnosis not present

## 2020-04-16 DIAGNOSIS — M1990 Unspecified osteoarthritis, unspecified site: Secondary | ICD-10-CM | POA: Diagnosis not present

## 2020-04-16 DIAGNOSIS — E039 Hypothyroidism, unspecified: Secondary | ICD-10-CM | POA: Diagnosis not present

## 2020-04-16 DIAGNOSIS — I10 Essential (primary) hypertension: Secondary | ICD-10-CM | POA: Diagnosis not present

## 2020-04-25 DIAGNOSIS — Z23 Encounter for immunization: Secondary | ICD-10-CM | POA: Diagnosis not present

## 2020-05-04 DIAGNOSIS — H02821 Cysts of right upper eyelid: Secondary | ICD-10-CM | POA: Diagnosis not present

## 2020-05-04 DIAGNOSIS — H02822 Cysts of right lower eyelid: Secondary | ICD-10-CM | POA: Diagnosis not present

## 2020-05-04 DIAGNOSIS — H40023 Open angle with borderline findings, high risk, bilateral: Secondary | ICD-10-CM | POA: Diagnosis not present

## 2020-05-04 DIAGNOSIS — H04123 Dry eye syndrome of bilateral lacrimal glands: Secondary | ICD-10-CM | POA: Diagnosis not present

## 2020-05-25 DIAGNOSIS — E7800 Pure hypercholesterolemia, unspecified: Secondary | ICD-10-CM | POA: Diagnosis not present

## 2020-05-25 DIAGNOSIS — E039 Hypothyroidism, unspecified: Secondary | ICD-10-CM | POA: Diagnosis not present

## 2020-05-25 DIAGNOSIS — I10 Essential (primary) hypertension: Secondary | ICD-10-CM | POA: Diagnosis not present

## 2020-05-25 DIAGNOSIS — M47814 Spondylosis without myelopathy or radiculopathy, thoracic region: Secondary | ICD-10-CM | POA: Diagnosis not present

## 2020-06-20 DIAGNOSIS — E039 Hypothyroidism, unspecified: Secondary | ICD-10-CM | POA: Diagnosis not present

## 2020-06-20 DIAGNOSIS — E7800 Pure hypercholesterolemia, unspecified: Secondary | ICD-10-CM | POA: Diagnosis not present

## 2020-06-20 DIAGNOSIS — G8929 Other chronic pain: Secondary | ICD-10-CM | POA: Diagnosis not present

## 2020-06-20 DIAGNOSIS — I10 Essential (primary) hypertension: Secondary | ICD-10-CM | POA: Diagnosis not present

## 2020-07-03 DIAGNOSIS — G44209 Tension-type headache, unspecified, not intractable: Secondary | ICD-10-CM | POA: Diagnosis not present

## 2020-07-03 DIAGNOSIS — J0100 Acute maxillary sinusitis, unspecified: Secondary | ICD-10-CM | POA: Diagnosis not present

## 2020-07-17 DIAGNOSIS — E7800 Pure hypercholesterolemia, unspecified: Secondary | ICD-10-CM | POA: Diagnosis not present

## 2020-07-17 DIAGNOSIS — E039 Hypothyroidism, unspecified: Secondary | ICD-10-CM | POA: Diagnosis not present

## 2020-07-17 DIAGNOSIS — I10 Essential (primary) hypertension: Secondary | ICD-10-CM | POA: Diagnosis not present

## 2020-07-17 DIAGNOSIS — G8929 Other chronic pain: Secondary | ICD-10-CM | POA: Diagnosis not present

## 2020-08-22 DIAGNOSIS — C4442 Squamous cell carcinoma of skin of scalp and neck: Secondary | ICD-10-CM | POA: Diagnosis not present

## 2020-08-22 DIAGNOSIS — D229 Melanocytic nevi, unspecified: Secondary | ICD-10-CM | POA: Diagnosis not present

## 2020-08-22 DIAGNOSIS — L821 Other seborrheic keratosis: Secondary | ICD-10-CM | POA: Diagnosis not present

## 2020-08-22 DIAGNOSIS — D225 Melanocytic nevi of trunk: Secondary | ICD-10-CM | POA: Diagnosis not present

## 2020-08-22 DIAGNOSIS — D485 Neoplasm of uncertain behavior of skin: Secondary | ICD-10-CM | POA: Diagnosis not present

## 2020-08-27 DIAGNOSIS — E7800 Pure hypercholesterolemia, unspecified: Secondary | ICD-10-CM | POA: Diagnosis not present

## 2020-08-27 DIAGNOSIS — G8929 Other chronic pain: Secondary | ICD-10-CM | POA: Diagnosis not present

## 2020-08-27 DIAGNOSIS — E039 Hypothyroidism, unspecified: Secondary | ICD-10-CM | POA: Diagnosis not present

## 2020-08-27 DIAGNOSIS — I10 Essential (primary) hypertension: Secondary | ICD-10-CM | POA: Diagnosis not present

## 2020-09-03 DIAGNOSIS — E7800 Pure hypercholesterolemia, unspecified: Secondary | ICD-10-CM | POA: Diagnosis not present

## 2020-09-03 DIAGNOSIS — E039 Hypothyroidism, unspecified: Secondary | ICD-10-CM | POA: Diagnosis not present

## 2020-09-03 DIAGNOSIS — G8929 Other chronic pain: Secondary | ICD-10-CM | POA: Diagnosis not present

## 2020-09-03 DIAGNOSIS — I10 Essential (primary) hypertension: Secondary | ICD-10-CM | POA: Diagnosis not present

## 2020-09-04 DIAGNOSIS — R7303 Prediabetes: Secondary | ICD-10-CM | POA: Diagnosis not present

## 2020-09-04 DIAGNOSIS — E7800 Pure hypercholesterolemia, unspecified: Secondary | ICD-10-CM | POA: Diagnosis not present

## 2020-09-04 DIAGNOSIS — Z0000 Encounter for general adult medical examination without abnormal findings: Secondary | ICD-10-CM | POA: Diagnosis not present

## 2020-09-04 DIAGNOSIS — I10 Essential (primary) hypertension: Secondary | ICD-10-CM | POA: Diagnosis not present

## 2020-09-07 DIAGNOSIS — H04123 Dry eye syndrome of bilateral lacrimal glands: Secondary | ICD-10-CM | POA: Diagnosis not present

## 2020-09-07 DIAGNOSIS — H40023 Open angle with borderline findings, high risk, bilateral: Secondary | ICD-10-CM | POA: Diagnosis not present

## 2020-09-20 DIAGNOSIS — D0439 Carcinoma in situ of skin of other parts of face: Secondary | ICD-10-CM | POA: Diagnosis not present

## 2020-10-24 DIAGNOSIS — E039 Hypothyroidism, unspecified: Secondary | ICD-10-CM | POA: Diagnosis not present

## 2020-10-24 DIAGNOSIS — G8929 Other chronic pain: Secondary | ICD-10-CM | POA: Diagnosis not present

## 2020-10-24 DIAGNOSIS — I10 Essential (primary) hypertension: Secondary | ICD-10-CM | POA: Diagnosis not present

## 2020-10-24 DIAGNOSIS — E7800 Pure hypercholesterolemia, unspecified: Secondary | ICD-10-CM | POA: Diagnosis not present

## 2020-11-19 DIAGNOSIS — D0439 Carcinoma in situ of skin of other parts of face: Secondary | ICD-10-CM | POA: Diagnosis not present

## 2020-11-20 DIAGNOSIS — K219 Gastro-esophageal reflux disease without esophagitis: Secondary | ICD-10-CM | POA: Diagnosis not present

## 2020-11-20 DIAGNOSIS — E7800 Pure hypercholesterolemia, unspecified: Secondary | ICD-10-CM | POA: Diagnosis not present

## 2020-11-20 DIAGNOSIS — I10 Essential (primary) hypertension: Secondary | ICD-10-CM | POA: Diagnosis not present

## 2020-11-20 DIAGNOSIS — E039 Hypothyroidism, unspecified: Secondary | ICD-10-CM | POA: Diagnosis not present

## 2020-12-19 DIAGNOSIS — J40 Bronchitis, not specified as acute or chronic: Secondary | ICD-10-CM | POA: Diagnosis not present

## 2021-01-25 DIAGNOSIS — L905 Scar conditions and fibrosis of skin: Secondary | ICD-10-CM | POA: Diagnosis not present

## 2021-01-25 DIAGNOSIS — L570 Actinic keratosis: Secondary | ICD-10-CM | POA: Diagnosis not present

## 2021-01-25 DIAGNOSIS — Z85828 Personal history of other malignant neoplasm of skin: Secondary | ICD-10-CM | POA: Diagnosis not present

## 2021-02-04 ENCOUNTER — Other Ambulatory Visit: Payer: Self-pay | Admitting: Family Medicine

## 2021-02-04 DIAGNOSIS — Z1231 Encounter for screening mammogram for malignant neoplasm of breast: Secondary | ICD-10-CM

## 2021-02-08 DIAGNOSIS — H40023 Open angle with borderline findings, high risk, bilateral: Secondary | ICD-10-CM | POA: Diagnosis not present

## 2021-02-08 DIAGNOSIS — H04123 Dry eye syndrome of bilateral lacrimal glands: Secondary | ICD-10-CM | POA: Diagnosis not present

## 2021-03-04 DIAGNOSIS — R399 Unspecified symptoms and signs involving the genitourinary system: Secondary | ICD-10-CM | POA: Diagnosis not present

## 2021-03-04 DIAGNOSIS — N3000 Acute cystitis without hematuria: Secondary | ICD-10-CM | POA: Diagnosis not present

## 2021-03-13 DIAGNOSIS — L570 Actinic keratosis: Secondary | ICD-10-CM | POA: Diagnosis not present

## 2021-03-27 DIAGNOSIS — E7800 Pure hypercholesterolemia, unspecified: Secondary | ICD-10-CM | POA: Diagnosis not present

## 2021-03-27 DIAGNOSIS — I10 Essential (primary) hypertension: Secondary | ICD-10-CM | POA: Diagnosis not present

## 2021-03-27 DIAGNOSIS — G8929 Other chronic pain: Secondary | ICD-10-CM | POA: Diagnosis not present

## 2021-03-27 DIAGNOSIS — E039 Hypothyroidism, unspecified: Secondary | ICD-10-CM | POA: Diagnosis not present

## 2021-04-04 ENCOUNTER — Ambulatory Visit: Payer: Medicare Other

## 2021-04-05 DIAGNOSIS — E7800 Pure hypercholesterolemia, unspecified: Secondary | ICD-10-CM | POA: Diagnosis not present

## 2021-04-05 DIAGNOSIS — R7309 Other abnormal glucose: Secondary | ICD-10-CM | POA: Diagnosis not present

## 2021-04-05 DIAGNOSIS — E039 Hypothyroidism, unspecified: Secondary | ICD-10-CM | POA: Diagnosis not present

## 2021-04-05 DIAGNOSIS — G629 Polyneuropathy, unspecified: Secondary | ICD-10-CM | POA: Diagnosis not present

## 2021-04-05 DIAGNOSIS — I10 Essential (primary) hypertension: Secondary | ICD-10-CM | POA: Diagnosis not present

## 2021-04-08 ENCOUNTER — Other Ambulatory Visit: Payer: Self-pay | Admitting: Family Medicine

## 2021-04-08 DIAGNOSIS — G8929 Other chronic pain: Secondary | ICD-10-CM

## 2021-04-08 DIAGNOSIS — M47814 Spondylosis without myelopathy or radiculopathy, thoracic region: Secondary | ICD-10-CM

## 2021-04-22 ENCOUNTER — Ambulatory Visit
Admission: RE | Admit: 2021-04-22 | Discharge: 2021-04-22 | Disposition: A | Discharge status: Home / Self Care | Payer: Medicare Other | Source: Ambulatory Visit | Attending: Family Medicine | Admitting: Family Medicine

## 2021-04-22 ENCOUNTER — Other Ambulatory Visit: Payer: Self-pay

## 2021-04-22 DIAGNOSIS — M47814 Spondylosis without myelopathy or radiculopathy, thoracic region: Secondary | ICD-10-CM | POA: Diagnosis not present

## 2021-04-22 DIAGNOSIS — M5124 Other intervertebral disc displacement, thoracic region: Secondary | ICD-10-CM | POA: Diagnosis not present

## 2021-04-22 DIAGNOSIS — M545 Low back pain, unspecified: Secondary | ICD-10-CM | POA: Diagnosis not present

## 2021-04-22 DIAGNOSIS — M48061 Spinal stenosis, lumbar region without neurogenic claudication: Secondary | ICD-10-CM | POA: Diagnosis not present

## 2021-04-22 DIAGNOSIS — G8929 Other chronic pain: Secondary | ICD-10-CM

## 2021-04-22 DIAGNOSIS — M4319 Spondylolisthesis, multiple sites in spine: Secondary | ICD-10-CM | POA: Diagnosis not present

## 2021-04-22 DIAGNOSIS — M5135 Other intervertebral disc degeneration, thoracolumbar region: Secondary | ICD-10-CM | POA: Diagnosis not present

## 2021-04-23 ENCOUNTER — Ambulatory Visit
Admission: RE | Admit: 2021-04-23 | Discharge: 2021-04-23 | Disposition: A | Discharge status: Home / Self Care | Payer: Medicare Other | Source: Ambulatory Visit | Attending: Family Medicine | Admitting: Family Medicine

## 2021-04-23 DIAGNOSIS — Z1231 Encounter for screening mammogram for malignant neoplasm of breast: Secondary | ICD-10-CM | POA: Diagnosis not present

## 2021-04-24 DIAGNOSIS — Z23 Encounter for immunization: Secondary | ICD-10-CM | POA: Diagnosis not present

## 2021-04-29 DIAGNOSIS — H524 Presbyopia: Secondary | ICD-10-CM | POA: Diagnosis not present

## 2021-05-03 DIAGNOSIS — Z85828 Personal history of other malignant neoplasm of skin: Secondary | ICD-10-CM | POA: Diagnosis not present

## 2021-05-03 DIAGNOSIS — Z08 Encounter for follow-up examination after completed treatment for malignant neoplasm: Secondary | ICD-10-CM | POA: Diagnosis not present

## 2021-05-03 DIAGNOSIS — Z86007 Personal history of in-situ neoplasm of skin: Secondary | ICD-10-CM | POA: Diagnosis not present

## 2021-05-03 DIAGNOSIS — L905 Scar conditions and fibrosis of skin: Secondary | ICD-10-CM | POA: Diagnosis not present

## 2021-05-06 DIAGNOSIS — M4150 Other secondary scoliosis, site unspecified: Secondary | ICD-10-CM | POA: Diagnosis not present

## 2021-05-06 DIAGNOSIS — M545 Low back pain, unspecified: Secondary | ICD-10-CM | POA: Diagnosis not present

## 2021-05-28 DIAGNOSIS — M5136 Other intervertebral disc degeneration, lumbar region: Secondary | ICD-10-CM | POA: Diagnosis not present

## 2021-05-28 DIAGNOSIS — M4155 Other secondary scoliosis, thoracolumbar region: Secondary | ICD-10-CM | POA: Diagnosis not present

## 2021-05-31 DIAGNOSIS — M4155 Other secondary scoliosis, thoracolumbar region: Secondary | ICD-10-CM | POA: Diagnosis not present

## 2021-05-31 DIAGNOSIS — M5136 Other intervertebral disc degeneration, lumbar region: Secondary | ICD-10-CM | POA: Diagnosis not present

## 2021-06-06 DIAGNOSIS — M4155 Other secondary scoliosis, thoracolumbar region: Secondary | ICD-10-CM | POA: Diagnosis not present

## 2021-06-06 DIAGNOSIS — M5136 Other intervertebral disc degeneration, lumbar region: Secondary | ICD-10-CM | POA: Diagnosis not present

## 2021-06-12 DIAGNOSIS — H04123 Dry eye syndrome of bilateral lacrimal glands: Secondary | ICD-10-CM | POA: Diagnosis not present

## 2021-06-12 DIAGNOSIS — H401222 Low-tension glaucoma, left eye, moderate stage: Secondary | ICD-10-CM | POA: Diagnosis not present

## 2021-06-12 DIAGNOSIS — H401211 Low-tension glaucoma, right eye, mild stage: Secondary | ICD-10-CM | POA: Diagnosis not present

## 2021-06-13 DIAGNOSIS — M5136 Other intervertebral disc degeneration, lumbar region: Secondary | ICD-10-CM | POA: Diagnosis not present

## 2021-06-13 DIAGNOSIS — M4155 Other secondary scoliosis, thoracolumbar region: Secondary | ICD-10-CM | POA: Diagnosis not present

## 2021-06-18 DIAGNOSIS — M5136 Other intervertebral disc degeneration, lumbar region: Secondary | ICD-10-CM | POA: Diagnosis not present

## 2021-06-18 DIAGNOSIS — M4155 Other secondary scoliosis, thoracolumbar region: Secondary | ICD-10-CM | POA: Diagnosis not present

## 2021-06-27 DIAGNOSIS — M4155 Other secondary scoliosis, thoracolumbar region: Secondary | ICD-10-CM | POA: Diagnosis not present

## 2021-06-27 DIAGNOSIS — M5136 Other intervertebral disc degeneration, lumbar region: Secondary | ICD-10-CM | POA: Diagnosis not present

## 2021-08-22 DIAGNOSIS — L570 Actinic keratosis: Secondary | ICD-10-CM | POA: Diagnosis not present

## 2021-08-22 DIAGNOSIS — L821 Other seborrheic keratosis: Secondary | ICD-10-CM | POA: Diagnosis not present

## 2021-08-22 DIAGNOSIS — D225 Melanocytic nevi of trunk: Secondary | ICD-10-CM | POA: Diagnosis not present

## 2021-08-22 DIAGNOSIS — L814 Other melanin hyperpigmentation: Secondary | ICD-10-CM | POA: Diagnosis not present

## 2021-09-09 DIAGNOSIS — E039 Hypothyroidism, unspecified: Secondary | ICD-10-CM | POA: Diagnosis not present

## 2021-09-09 DIAGNOSIS — I10 Essential (primary) hypertension: Secondary | ICD-10-CM | POA: Diagnosis not present

## 2021-09-09 DIAGNOSIS — G629 Polyneuropathy, unspecified: Secondary | ICD-10-CM | POA: Diagnosis not present

## 2021-09-09 DIAGNOSIS — Z0000 Encounter for general adult medical examination without abnormal findings: Secondary | ICD-10-CM | POA: Diagnosis not present

## 2021-09-09 DIAGNOSIS — R7303 Prediabetes: Secondary | ICD-10-CM | POA: Diagnosis not present

## 2021-09-09 DIAGNOSIS — M47814 Spondylosis without myelopathy or radiculopathy, thoracic region: Secondary | ICD-10-CM | POA: Diagnosis not present

## 2021-09-09 DIAGNOSIS — E7800 Pure hypercholesterolemia, unspecified: Secondary | ICD-10-CM | POA: Diagnosis not present

## 2021-09-25 DIAGNOSIS — M546 Pain in thoracic spine: Secondary | ICD-10-CM | POA: Diagnosis not present

## 2021-09-30 DIAGNOSIS — E7800 Pure hypercholesterolemia, unspecified: Secondary | ICD-10-CM | POA: Diagnosis not present

## 2021-09-30 DIAGNOSIS — M546 Pain in thoracic spine: Secondary | ICD-10-CM | POA: Diagnosis not present

## 2021-09-30 DIAGNOSIS — N3000 Acute cystitis without hematuria: Secondary | ICD-10-CM | POA: Diagnosis not present

## 2021-10-10 DIAGNOSIS — N390 Urinary tract infection, site not specified: Secondary | ICD-10-CM | POA: Diagnosis not present

## 2021-10-21 DIAGNOSIS — M545 Low back pain, unspecified: Secondary | ICD-10-CM | POA: Diagnosis not present

## 2021-11-08 DIAGNOSIS — R399 Unspecified symptoms and signs involving the genitourinary system: Secondary | ICD-10-CM | POA: Diagnosis not present

## 2021-11-18 DIAGNOSIS — H04123 Dry eye syndrome of bilateral lacrimal glands: Secondary | ICD-10-CM | POA: Diagnosis not present

## 2021-11-18 DIAGNOSIS — H401222 Low-tension glaucoma, left eye, moderate stage: Secondary | ICD-10-CM | POA: Diagnosis not present

## 2021-11-18 DIAGNOSIS — H401211 Low-tension glaucoma, right eye, mild stage: Secondary | ICD-10-CM | POA: Diagnosis not present

## 2021-11-29 DIAGNOSIS — G8929 Other chronic pain: Secondary | ICD-10-CM | POA: Diagnosis not present

## 2021-11-29 DIAGNOSIS — M545 Low back pain, unspecified: Secondary | ICD-10-CM | POA: Diagnosis not present

## 2021-11-29 DIAGNOSIS — N390 Urinary tract infection, site not specified: Secondary | ICD-10-CM | POA: Diagnosis not present

## 2021-12-18 DIAGNOSIS — R350 Frequency of micturition: Secondary | ICD-10-CM | POA: Diagnosis not present

## 2021-12-18 DIAGNOSIS — N390 Urinary tract infection, site not specified: Secondary | ICD-10-CM | POA: Diagnosis not present

## 2022-01-07 DIAGNOSIS — N139 Obstructive and reflux uropathy, unspecified: Secondary | ICD-10-CM | POA: Diagnosis not present

## 2022-01-07 DIAGNOSIS — R109 Unspecified abdominal pain: Secondary | ICD-10-CM | POA: Diagnosis not present

## 2022-01-22 DIAGNOSIS — R300 Dysuria: Secondary | ICD-10-CM | POA: Diagnosis not present

## 2022-01-22 DIAGNOSIS — R109 Unspecified abdominal pain: Secondary | ICD-10-CM | POA: Diagnosis not present

## 2022-02-04 ENCOUNTER — Other Ambulatory Visit: Payer: Self-pay

## 2022-02-04 ENCOUNTER — Emergency Department (HOSPITAL_BASED_OUTPATIENT_CLINIC_OR_DEPARTMENT_OTHER): Payer: Medicare Other

## 2022-02-04 ENCOUNTER — Emergency Department (HOSPITAL_BASED_OUTPATIENT_CLINIC_OR_DEPARTMENT_OTHER)
Admission: EM | Admit: 2022-02-04 | Discharge: 2022-02-04 | Disposition: A | Discharge status: Home / Self Care | Payer: Medicare Other | Attending: Emergency Medicine | Admitting: Emergency Medicine

## 2022-02-04 ENCOUNTER — Encounter (HOSPITAL_BASED_OUTPATIENT_CLINIC_OR_DEPARTMENT_OTHER): Payer: Self-pay | Admitting: Emergency Medicine

## 2022-02-04 DIAGNOSIS — R109 Unspecified abdominal pain: Secondary | ICD-10-CM | POA: Diagnosis not present

## 2022-02-04 DIAGNOSIS — K5900 Constipation, unspecified: Secondary | ICD-10-CM | POA: Diagnosis not present

## 2022-02-04 DIAGNOSIS — Z7982 Long term (current) use of aspirin: Secondary | ICD-10-CM | POA: Diagnosis not present

## 2022-02-04 LAB — BASIC METABOLIC PANEL
Anion gap: 7 (ref 5–15)
BUN: 25 mg/dL — ABNORMAL HIGH (ref 8–23)
CO2: 25 mmol/L (ref 22–32)
Calcium: 9.7 mg/dL (ref 8.9–10.3)
Chloride: 103 mmol/L (ref 98–111)
Creatinine, Ser: 0.75 mg/dL (ref 0.44–1.00)
GFR, Estimated: >60 mL/min (ref >60)
Glucose, Bld: 115 mg/dL — ABNORMAL HIGH (ref 70–99)
Potassium: 4.2 mmol/L (ref 3.5–5.1)
Sodium: 135 mmol/L (ref 135–145)

## 2022-02-04 LAB — URINALYSIS, ROUTINE W REFLEX MICROSCOPIC
Bilirubin Urine: NEGATIVE
Glucose, UA: NEGATIVE mg/dL
Ketones, ur: NEGATIVE mg/dL
Leukocytes,Ua: NEGATIVE
Nitrite: NEGATIVE
Protein, ur: NEGATIVE mg/dL
Specific Gravity, Urine: 1.02 (ref 1.005–1.030)
pH: 5.5 (ref 5.0–8.0)

## 2022-02-04 LAB — HEPATIC FUNCTION PANEL
ALT: 16 U/L (ref 0–44)
AST: 19 U/L (ref 15–41)
Albumin: 4.4 g/dL (ref 3.5–5.0)
Alkaline Phosphatase: 51 U/L (ref 38–126)
Bilirubin, Direct: <0.1 mg/dL (ref 0.0–0.2)
Total Bilirubin: 0.7 mg/dL (ref 0.3–1.2)
Total Protein: 7.5 g/dL (ref 6.5–8.1)

## 2022-02-04 LAB — CBC
HCT: 39.3 % (ref 36.0–46.0)
Hemoglobin: 13.2 g/dL (ref 12.0–15.0)
MCH: 31.1 pg (ref 26.0–34.0)
MCHC: 33.6 g/dL (ref 30.0–36.0)
MCV: 92.7 fL (ref 80.0–100.0)
Platelets: 267 K/uL (ref 150–400)
RBC: 4.24 MIL/uL (ref 3.87–5.11)
RDW: 12.2 % (ref 11.5–15.5)
WBC: 5.9 K/uL (ref 4.0–10.5)
nRBC: 0 % (ref 0.0–0.2)

## 2022-02-04 LAB — URINALYSIS, MICROSCOPIC (REFLEX)

## 2022-02-04 MED ORDER — IOHEXOL 300 MG/ML  SOLN
100.0000 mL | Freq: Once | INTRAMUSCULAR | Status: AC | PRN
Start: 2022-02-04 — End: 2022-02-04
  Administered 2022-02-04: 100 mL via INTRAVENOUS

## 2022-02-04 MED ORDER — DOCUSATE SODIUM 100 MG PO CAPS: 100.0000 mg | ORAL_CAPSULE | Freq: Two times a day (BID) | ORAL | 0 refills | Status: AC

## 2022-02-04 NOTE — ED Provider Notes (Signed)
[facility] Provider Note   CSN: [id number] Arrival date & time: 02/04/22  1135     History  Chief Complaint  Patient presents with   Flank Pain    Ara Grandmaison is a 86 y.o. female with a past medical history of recurrent UTI followed by [facility] presenting today with right flank pain.  She reports that she has had a UTI on and off since March.  She is currently on day 7 of ciprofloxacin.  She was on Bactrim just before this but she was called by her physician saying that she needed to switch to Cipro.  She says her symptoms have not been improving over the last week.  Her right side pain is so severe that she cannot get any sleep.  Denies any objective fevers but did feel hot yesterday so she called her PCP asking for an appointment.  They were unable to fit her in so she decided to come here today.  No dysuria   Flank Pain       Home Medications Prior to Admission medications   Medication Sig Start Date End Date Taking? Authorizing Provider  amLODipine (NORVASC) 5 MG tablet Take 5 mg by mouth daily.  09/23/13   [provider]  aspirin 81 MG tablet Take 162 mg by mouth daily.     [provider]  Biotin 5000 MCG CAPS Take 1 capsule by mouth daily.     [provider]  KRILL OIL PO Take 1 capsule by mouth daily.      [provider]  levothyroxine (SYNTHROID, LEVOTHROID) 25 MCG tablet Take 25 mcg by mouth daily. 11/14/17   [provider]  LORazepam (ATIVAN) 0.5 MG tablet Take 0.5 mg by mouth 2 (two) times daily.  09/05/13   [provider]  losartan (COZAAR) 100 MG tablet Take 100 mg by mouth daily.  09/17/13   [provider]  Methylcellulose, Laxative, (CITRUCEL PO) Take by mouth. 2 tablespoons daily    [provider]  Multiple Vitamin (MULTIVITAMIN) tablet Take 1 tablet by mouth daily.      [provider]  ondansetron (ZOFRAN) 4 MG tablet Take 1 tablet (4 mg total)  by mouth every 6 (six) hours. 01/06/18   Patrecia Pour, MD  simvastatin (ZOCOR) 20 MG tablet Take 20 mg by mouth daily.     [provider]  sucralfate (CARAFATE) 1 g tablet Take 1 g by mouth 2 (two) times daily. 12/16/17   [provider]      Allergies    Augmentin [amoxicillin-pot clavulanate], Clarithromycin, Levaquin  [levofloxacin in d5w], Levofloxacin, Levofloxacin, Metronidazole, and Tramadol hcl    Review of Systems   Review of Systems  Constitutional:  Positive for chills. Negative for fever.  Genitourinary:  Positive for flank pain, frequency and urgency. Negative for dysuria and hematuria.    Physical Exam Updated Vital Signs BP (!) 167/53 (BP Location: Left Arm)   Pulse (!) 52   Temp 98.2 F (36.8 C) (Oral)   Resp 16   Ht 5' 2.5" (1.588 m)   Wt 65.8 kg   SpO2 100%   BMI 26.10 kg/m  Physical Exam Vitals and nursing note reviewed.  Constitutional:      General: She is not in acute distress.    Appearance: Normal appearance. She is not ill-appearing.  HENT:     Head: Normocephalic and atraumatic.  Eyes:     General: No scleral icterus.  Conjunctiva/sclera: Conjunctivae normal.  Cardiovascular:     Rate and Rhythm: Normal rate.  Pulmonary:     Effort: Pulmonary effort is normal. No respiratory distress.  Abdominal:     General: Abdomen is flat.     Palpations: Abdomen is soft.     Tenderness: There is no abdominal tenderness.     Comments: Flat nontender abdomen.  Nondistended.  Negative left-sided CVA, positive right  Skin:    General: Skin is warm and dry.     Findings: No rash.  Neurological:     Mental Status: She is alert.  Psychiatric:        Mood and Affect: Mood normal.     ED Results / Procedures / Treatments   Labs (all labs ordered are listed, but only abnormal results are displayed) Labs Reviewed  URINALYSIS, ROUTINE W REFLEX MICROSCOPIC - Abnormal; Notable for the following components:      Result Value   Hgb urine  dipstick TRACE (*)    All other components within normal limits  BASIC METABOLIC PANEL - Abnormal; Notable for the following components:   Glucose, Bld 115 (*)    BUN 25 (*)    All other components within normal limits  URINALYSIS, MICROSCOPIC (REFLEX) - Abnormal; Notable for the following components:   Bacteria, UA RARE (*)    All other components within normal limits  URINE CULTURE  CBC  HEPATIC FUNCTION PANEL    EKG None  Radiology CT ABDOMEN PELVIS W CONTRAST  Result Date: 02/04/2022 CLINICAL DATA:  Right flank pain. Recurrent urinary tract infections. EXAM: CT ABDOMEN AND PELVIS WITH CONTRAST TECHNIQUE: Multidetector CT imaging of the abdomen and pelvis was performed using the standard protocol following bolus administration of intravenous contrast. RADIATION DOSE REDUCTION: This exam was performed according to the departmental dose-optimization program which includes automated exposure control, adjustment of the mA and/or kV according to patient size and/or use of iterative reconstruction technique. CONTRAST:  116m OMNIPAQUE IOHEXOL 300 MG/ML  SOLN COMPARISON:  01/11/2020 FINDINGS: Lower Chest: No acute findings. Hepatobiliary: No hepatic masses identified. Prior cholecystectomy. No evidence of biliary obstruction. Pancreas:  No mass or inflammatory changes. Spleen: Within normal limits in size and appearance. Adrenals/Urinary Tract: No masses identified. No evidence of ureteral calculi or hydronephrosis. Stomach/Bowel: No evidence of obstruction, inflammatory process or abnormal fluid collections. Diverticulosis is seen mainly involving the descending and sigmoid colon, however there is no evidence of diverticulitis. Large amount of stool noted throughout the colon. Vascular/Lymphatic: No pathologically enlarged lymph nodes. No acute vascular findings. Aortic atherosclerotic calcification incidentally noted. Reproductive: Prior hysterectomy noted. Adnexal regions are unremarkable in  appearance. Other:  None. Musculoskeletal:  No suspicious bone lesions identified. IMPRESSION: No acute findings. Colonic diverticulosis, without radiographic evidence of diverticulitis. Large stool burden noted; recommend clinical correlation for possible constipation. Aortic Atherosclerosis (ICD10-I70.0). Electronically Signed   By: JMarlaine HindM.D.   On: 02/04/2022 13:57    Procedures Procedures   Medications Ordered in ED Medications  iohexol (OMNIPAQUE) 300 MG/ML solution 100 mL (100 mLs Intravenous Contrast Given 02/04/22 1334)    ED Course/ Medical Decision Making/ A&P                           Medical Decision Making Amount and/or Complexity of Data Reviewed Labs: ordered. Radiology: ordered.  Risk OTC drugs. Prescription drug management.   This patient presents to the ED for concern of flank pain.  Differential includes but is  not limited to pyelonephritis, nephrolithiasis, malignancy, torsion.    This is not an exhaustive differential.    Past Medical History / Co-morbidities / Social History: Recurrent UTI   Additional history: Unable to see external records with equal   Physical Exam: Pertinent physical exam findings include right-sided CVA tenderness  Lab Tests: I ordered, and personally interpreted labs.  No pertinent findings.   Imaging Studies: I ordered and independently visualized and interpreted CT scan which showed diverticulosis and constipation. I agree with the radiologist interpretation.     Medications: Medications declined.    Disposition: This is a 86 year old female who presented today with concern for kidney infection.  Has been having recurrent UTIs since March.  Currently on ciprofloxacin.  Work-up without infection.  Lab work without leukocytosis.  CT shows constipation.  She reports a history of constipation but she takes a fiber supplement and MiraLAX for this and feels like she has been having normal bowel movements.  She will speak  to her PCP about her regimen.  I will send docusate to her pharmacy and discharge her with close follow-up.  She is agreeable to this.     I discussed this case with my attending physician Dr. Billy Fischer who cosigned this note including patient's presenting symptoms, physical exam, and planned diagnostics and interventions. Attending physician stated agreement with plan or made changes to plan which were implemented.     Final Clinical Impression(s) / ED Diagnoses Final diagnoses:  Constipation, unspecified constipation type    Rx / DC Orders ED Discharge Orders          Ordered    docusate sodium (COLACE) 100 MG capsule  Every 12 hours        02/04/22 1428           Results and diagnoses were explained to the patient. Return precautions discussed in full. Patient had no additional questions and expressed complete understanding.   This chart was dictated using voice recognition software.  Despite best efforts to proofread,  errors can occur which can change the documentation meaning.    Darliss Ridgel 02/04/22 1444    Gareth Morgan, MD 02/05/22 2352

## 2022-02-04 NOTE — ED Notes (Signed)
Patient off the floor for scan  

## 2022-02-04 NOTE — ED Notes (Signed)
Client in to the ED for c/o intermittent Rt flank pain, states she has had intermittent UTIs since March, currently on Cipro PO. Denies having any fevers. Comfort measures provided, warm blanket provided. Safety measures in place.

## 2022-02-04 NOTE — Discharge Instructions (Addendum)
You do not have a kidney infection today.  Your urinary tract infection also looks better.  Your CT scan shows large amounts of constipation.  Continue to take the fiber and MiraLAX prescribed to you by your doctor.  I have also sent pills to take in the morning and at night for the next week for your constipation.  Ultimately, make an appointment with your doctor about this visit and your constipation.  Please return with any worsening symptoms.

## 2022-02-04 NOTE — ED Triage Notes (Signed)
Flank pain since march.  Pt states it has worsened over the last 2 weeks.  Pt has been on multiple antibiotics for UTI but continues with symptoms.

## 2022-02-05 LAB — URINE CULTURE: Culture: NO GROWTH

## 2022-02-13 DIAGNOSIS — E7800 Pure hypercholesterolemia, unspecified: Secondary | ICD-10-CM | POA: Diagnosis not present

## 2022-02-13 DIAGNOSIS — R7303 Prediabetes: Secondary | ICD-10-CM | POA: Diagnosis not present

## 2022-02-13 DIAGNOSIS — K5900 Constipation, unspecified: Secondary | ICD-10-CM | POA: Diagnosis not present

## 2022-02-13 DIAGNOSIS — I10 Essential (primary) hypertension: Secondary | ICD-10-CM | POA: Diagnosis not present

## 2022-03-05 DIAGNOSIS — H401222 Low-tension glaucoma, left eye, moderate stage: Secondary | ICD-10-CM | POA: Diagnosis not present

## 2022-03-05 DIAGNOSIS — H04123 Dry eye syndrome of bilateral lacrimal glands: Secondary | ICD-10-CM | POA: Diagnosis not present

## 2022-03-05 DIAGNOSIS — H401211 Low-tension glaucoma, right eye, mild stage: Secondary | ICD-10-CM | POA: Diagnosis not present

## 2022-03-17 ENCOUNTER — Other Ambulatory Visit: Payer: Self-pay | Admitting: Family Medicine

## 2022-03-17 DIAGNOSIS — Z1231 Encounter for screening mammogram for malignant neoplasm of breast: Secondary | ICD-10-CM

## 2022-04-11 ENCOUNTER — Encounter: Payer: Self-pay | Admitting: Nurse Practitioner

## 2022-04-11 ENCOUNTER — Ambulatory Visit: Payer: Medicare Other | Admitting: Nurse Practitioner

## 2022-04-11 VITALS — BP 124/60 | HR 53 | Ht 61.0 in | Wt 148.1 lb

## 2022-04-11 DIAGNOSIS — K5904 Chronic idiopathic constipation: Secondary | ICD-10-CM | POA: Diagnosis not present

## 2022-04-11 DIAGNOSIS — K5900 Constipation, unspecified: Secondary | ICD-10-CM | POA: Diagnosis not present

## 2022-04-11 DIAGNOSIS — R1011 Right upper quadrant pain: Secondary | ICD-10-CM | POA: Diagnosis not present

## 2022-04-11 MED ORDER — LINACLOTIDE 72 MCG PO CAPS: 72.0000 ug | ORAL_CAPSULE | Freq: Every day | ORAL | 1 refills | Status: DC

## 2022-04-11 NOTE — Patient Instructions (Addendum)
You have been scheduled for a follow up with Dr Fuller Plan on 06/04/22 at 9:50 am. ________________________________________________________ Please check with your primary care provider to see if you have had your "TSH" (thyroid stimulating hormone) labs checked within the last year. If you have not, you should get that done. _______________________________________________________  We have sent the following medications to your pharmacy for you to pick up at your convenience: Linzess 72 mcg once daily _______________________________________________________  Please purchase the following medications over the counter and take as directed: Benefiber 1 tablespoon once daily  Miralax 17 grams (1 capful) dissolved in at least 8 ounces water/juice every night as tolerated.  _______________________________________________________  If you are age 84 or older, your body mass index should be between 23-30. Your Body mass index is 27.99 kg/m. If this is out of the aforementioned range listed, please consider follow up with your Primary Care Provider.  If you are age 84 or younger, your body mass index should be between 19-25. Your Body mass index is 27.99 kg/m. If this is out of the aformentioned range listed, please consider follow up with your Primary Care Provider.   ________________________________________________________  The [facility] providers would like to encourage you to use [facility] to communicate with providers for non-urgent requests or questions.  Due to long hold times on the telephone, sending your provider a message by [facility] may be a faster and more efficient way to get a response.  Please allow 48 business hours for a response.  Please remember that this is for non-urgent requests.  _______________________________________________________  Due to recent changes in healthcare laws, you may see the results of your imaging and laboratory studies on MyChart before your provider has had a  chance to review them.  We understand that in some cases there may be results that are confusing or concerning to you. Not all laboratory results come back in the same time frame and the provider may be waiting for multiple results in order to interpret others.  Please give [place] 48 hours in order for your provider to thoroughly review all the results before contacting the office for clarification of your results.

## 2022-04-11 NOTE — Progress Notes (Unsigned)
04/11/2022 Jasmine Smith [id number] 1935/09/13   CHIEF COMPLAINT: Constipation   HISTORY OF PRESENT ILLNESS: Jasmine Smith is an 86 year old female with a past medical history of anxiety, hypertension, hyperlipidemia, TIA, GERD, constipation, diverticulosis and colon polyps. She is followed by Jasmine Smith. She presents today with complaints of having constipation. She passes multiple small pieces of stool daily. No rectal bleeding. She had right sided abdominal pain and went to the ED 02/04/2022 for further evaluation. UA/culture negative for UTI. She reported having intermittent UTIs x 4 months treated with Cipro. CTAP diverticulosis without diverticulitis and a large stool burden in the colon. She was discharged home with instructions to take Colace. She doubled her dose of citrucel and Miralax without improvement, still feels constipated and has mild intermittent right sided abdominal/flank pain. Her most recent colonoscopy was 05/2015 which showed one tubular adenomatous polyp removed from the descending colon and moderate diverticulosis to the left colon. Daughter with history of colon polyps. She wishes to undergo a colonoscopy.     Latest Ref Rng & Units 02/04/2022   12:35 PM 01/06/2018    6:35 AM 01/05/2018   11:21 AM  CBC  WBC 4.0 - 10.5 K/uL 5.9  8.0  8.5   Hemoglobin 12.0 - 15.0 g/dL 13.2  13.7  14.5   Hematocrit 36.0 - 46.0 % 39.3  42.4  44.5   Platelets 150 - 400 K/uL 267  254  253        Latest Ref Rng & Units 02/04/2022   12:35 PM 01/06/2018    6:35 AM 01/05/2018    8:47 PM  CMP  Glucose 70 - 99 mg/dL 115  89    BUN 8 - 23 mg/dL 25  20    Creatinine 0.44 - 1.00 mg/dL 0.75  0.90    Sodium 135 - 145 mmol/L 135  138    Potassium 3.5 - 5.1 mmol/L 4.2  4.9    Chloride 98 - 111 mmol/L 103  101    CO2 22 - 32 mmol/L 25  29    Calcium 8.9 - 10.3 mg/dL 9.7  9.6    Total Protein 6.5 - 8.1 g/dL 7.5   7.0   Total Bilirubin 0.3 - 1.2 mg/dL 0.7   1.1   Alkaline Phos 38 - 126 U/L  51   56   AST 15 - 41 U/L 19   19   ALT 0 - 44 U/L 16   20     CTAP with contrast 02/04/2022: Lower Chest: No acute findings.   Hepatobiliary: No hepatic masses identified. Prior cholecystectomy. No evidence of biliary obstruction.   Pancreas:  No mass or inflammatory changes.   Spleen: Within normal limits in size and appearance.   Adrenals/Urinary Tract: No masses identified. No evidence of ureteral calculi or hydronephrosis.   Stomach/Bowel: No evidence of obstruction, inflammatory process or abnormal fluid collections. Diverticulosis is seen mainly involving the descending and sigmoid colon, however there is no evidence of diverticulitis. Large amount of stool noted throughout the colon.   Vascular/Lymphatic: No pathologically enlarged lymph nodes. No acute vascular findings. Aortic atherosclerotic calcification incidentally noted.   Reproductive: Prior hysterectomy noted. Adnexal regions are unremarkable in appearance.   Other:  None.   Musculoskeletal:  No suspicious bone lesions identified.   IMPRESSION: No acute findings.   Colonic diverticulosis, without radiographic evidence of diverticulitis.   Large stool burden noted; recommend clinical correlation for possible constipation.   Aortic  Atherosclerosis  Colonoscopy 06/19/2015 by Jasmine Smith: 1. Moderate diverticulosis in the sigmoid colon and descending colon 2. Sessile polyp in the descending colon; polypectomy performed with a cold snare -No further screening colonoscopies due to age  Past Medical History:  Diagnosis Date   Anxiety    CVA (cerebral infarction)    pt stated MRI 2014 showed mini strokes   Diverticulosis    Esophageal reflux    GERD (gastroesophageal reflux disease)    Hyperlipidemia    Hypertension    PVC (premature ventricular contraction)    Tubular adenoma of colon 05/2015   Past Surgical History:  Procedure Laterality Date   ABDOMINAL HYSTERECTOMY  2002   with bladder tact    APPENDECTOMY     Bladder tack and sling  2002   BREAST CYST ASPIRATION Right    BREAST CYST EXCISION Left    x 2   BREAST EXCISIONAL BIOPSY Right 1996   benign   BREAST EXCISIONAL BIOPSY Right 1992   benign   BREAST EXCISIONAL BIOPSY Left 1987   benign   CHOLECYSTECTOMY  1986   GANGLION CYST EXCISION Right    wrist   LUNG REMOVAL, PARTIAL     RML   Right rotator cuff surgery Right 2009   SHOULDER SURGERY Left 07/2014   Social History: Nonsmoker. No alcohol. No drug use.   Family History: family history includes Colon polyps in her sister; Diabetes in her brother, maternal aunt, and mother; Heart attack in her brother; Heart failure in her father; Lung cancer in her father; Prostate cancer in her brother; Stomach cancer in her paternal grandfather and paternal grandmother. Daughter had colon polyps.   Allergies  Allergen Reactions   Augmentin [Amoxicillin-Pot Clavulanate] Other (See Comments)    Abdominal pain   Clarithromycin Nausea Only and Other (See Comments)   Levaquin  [Levofloxacin In D5w]    Levofloxacin     Poor Indigestion    Levofloxacin Other (See Comments) and Nausea Only    Poor Indigestion     Metronidazole Nausea And Vomiting   Tramadol Hcl Nausea And Vomiting      Outpatient Encounter Medications as of 04/11/2022  Medication Sig   amLODipine (NORVASC) 5 MG tablet Take 5 mg by mouth daily.    aspirin 81 MG tablet Take 162 mg by mouth daily.    Biotin 5000 MCG CAPS Take 1 capsule by mouth daily.    KRILL OIL PO Take 1 capsule by mouth daily.     levothyroxine (SYNTHROID, LEVOTHROID) 25 MCG tablet Take 25 mcg by mouth daily.   LORazepam (ATIVAN) 0.5 MG tablet Take 0.5 mg by mouth as needed.   losartan (COZAAR) 100 MG tablet Take 100 mg by mouth daily.    Multiple Vitamin (MULTIVITAMIN) tablet Take 1 tablet by mouth daily.     Methylcellulose, Laxative, (CITRUCEL PO) Take by mouth. 2 tablespoons daily   ondansetron (ZOFRAN) 4 MG tablet Take 1 tablet (4  mg total) by mouth every 6 (six) hours. (Patient not taking: Reported on 04/11/2022)   simvastatin (ZOCOR) 20 MG tablet Take 20 mg by mouth daily.    sucralfate (CARAFATE) 1 g tablet Take 1 g by mouth 2 (two) times daily. (Patient not taking: Reported on 04/11/2022)   No facility-administered encounter medications on file as of 04/11/2022.   REVIEW OF SYSTEMS:  Gen: Denies fever, sweats or chills. No weight loss.  CV: Denies chest pain, palpitations or edema. Resp: Denies cough, shortness of breath of hemoptysis.  GI: See HPI. .  GU : Recurrent UTIs. MS: Denies joint pain, muscles aches or weakness. Derm: Denies rash, itchiness, skin lesions or unhealing ulcers. Psych: Denies depression, anxiety or memory loss. Heme: Denies bruising, easy bleeding. Neuro:  Denies headaches, dizziness or paresthesias. Endo:  Denies any problems with DM, thyroid or adrenal function.  PHYSICAL EXAM: BP 124/60   Pulse (!) 53   Ht '5\' 1"'$  (1.549 m) Comment: measured without shoes  Wt 148 lb 2 oz (67.2 kg)   BMI 27.99 kg/m   Wt Readings from Last 3 Encounters:  04/11/22 148 lb 2 oz (67.2 kg)  02/04/22 145 lb (65.8 kg)  01/06/18 144 lb 1.6 oz (65.4 kg)    General: 86 year old female in NAD. Head: Normocephalic and atraumatic. Eyes:  Sclerae non-icteric, conjunctive pink. Ears: Normal auditory acuity. Mouth: Dentition intact. No ulcers or lesions.  Neck: Supple, no lymphadenopathy or thyromegaly.  Lungs: Clear bilaterally to auscultation without wheezes, crackles or rhonchi. Heart: Regular rate and rhythm. No murmur, rub or gallop appreciated.  Abdomen: Soft, nondistended. Mild tenderness to the right mid abdomen without rebound or guarding. No masses. No hepatosplenomegaly. Normoactive bowel sounds x 4 quadrants.  Rectal: Deferred.  Musculoskeletal: Symmetrical with no gross deformities. Skin: Warm and dry. No rash or lesions on visible extremities. Extremities: No edema. Neurological: Alert oriented  x 4, no focal deficits.  Psychological:  Alert and cooperative. Normal mood and affect.  ASSESSMENT AND Smith:  8) 86 year old female with chronic constipation  -Linzess 76mg one tab po to be taken 30 minutes before breakfast  -Miralax Q HS as needed, as tolerated  -Stop Citrucel -Try Benefiber 1 TBSP QD  2) Right flank/right mid abdominal pain. CTAP 02/04/2022 showed stool burden otherwise unremarkable.  -Treat constipation, see Smith in # 1 -Patient to contact office if abdominal pain worsens   3) History of colon polyps. One tubular adenomatous polyp removed from the colon.  -No further colon polyp surveillance colonoscopies due to age    Patient wishes to undergo a colonoscopy due to having constipation, abdominal pain and history of colon polyps. I discussed with the patient no further colonoscopies recommended due to age, higher risk for procedure/sedation complications at the age of 86 I will discuss further with Dr. SFuller Smith   CC:  HShirline Frees MD

## 2022-04-13 DIAGNOSIS — K5900 Constipation, unspecified: Secondary | ICD-10-CM | POA: Insufficient documentation

## 2022-04-14 NOTE — Progress Notes (Signed)
Optimize constipation management prior to further evaluation. If constipation cannot be adequately controlled consider ACBE or BE as a safer option than colonoscopy to further evaluate.

## 2022-04-18 ENCOUNTER — Telehealth: Payer: Self-pay

## 2022-04-18 NOTE — Telephone Encounter (Signed)
Pt was made aware of Carl Best NP and Dr. Fuller Plan recommendations: Pt notified to call in if her Constipation does not resolve or worsens: Pt verbalized understanding with all questions answered.

## 2022-04-18 NOTE — Progress Notes (Signed)
Jasmine Smith, please inform the patient Dr. Fuller Plan reviewed her office visit full detail and he did not recommend a colonoscopy at this point.  If her constipation is not well controlled he would consider sending her for barium enema.  Patient to contact her office if her symptoms worsen.  Q

## 2022-04-18 NOTE — Telephone Encounter (Signed)
-----   Message from Noralyn Pick, NP sent at 04/18/2022  3:48 PM EDT -----    ----- Message ----- From: Ladene Artist, MD Sent: 04/14/2022   8:45 AM EDT To: Noralyn Pick, NP     ----- Message ----- From: Noralyn Pick, NP Sent: 04/13/2022   9:49 AM EDT To: Ladene Artist, MD

## 2022-04-24 ENCOUNTER — Ambulatory Visit
Admission: RE | Admit: 2022-04-24 | Discharge: 2022-04-24 | Disposition: A | Discharge status: Home / Self Care | Payer: Medicare Other | Source: Ambulatory Visit | Attending: Family Medicine | Admitting: Family Medicine

## 2022-04-24 DIAGNOSIS — Z1231 Encounter for screening mammogram for malignant neoplasm of breast: Secondary | ICD-10-CM

## 2022-04-29 ENCOUNTER — Ambulatory Visit: Payer: Medicare Other | Admitting: Gastroenterology

## 2022-06-04 ENCOUNTER — Ambulatory Visit: Payer: Medicare Other | Admitting: Gastroenterology

## 2022-06-04 ENCOUNTER — Other Ambulatory Visit (INDEPENDENT_AMBULATORY_CARE_PROVIDER_SITE_OTHER): Payer: Medicare Other

## 2022-06-04 ENCOUNTER — Encounter: Payer: Self-pay | Admitting: Gastroenterology

## 2022-06-04 VITALS — BP 146/72 | HR 52 | Ht 61.0 in | Wt 146.0 lb

## 2022-06-04 DIAGNOSIS — K5904 Chronic idiopathic constipation: Secondary | ICD-10-CM | POA: Diagnosis not present

## 2022-06-04 DIAGNOSIS — R109 Unspecified abdominal pain: Secondary | ICD-10-CM

## 2022-06-04 LAB — TSH: TSH: 2.72 u[IU]/mL (ref 0.35–5.50)

## 2022-06-04 NOTE — Progress Notes (Signed)
    Assessment     Chronic constipation, improved with current regimen Right flank/right abdominal pain, likely constipation related Personal history of adenomatous colon polyp, no longer in surveillance   Recommendations    DC Linzess, check TSH, Gas-X qid prn and titrate Miralax between 2-4 times daily for a complete BM daily Contact us if symptoms are not substantially improved with above Ongoing follow up with PCP and GI prn   HPI    This is an 86 year old female turning for follow-up of constipation and right flank/right abdominal pain.  She was evaluated on September 15 by Carl Best, NP for the same symptoms  - refer to her office note.  She was started on Linzess 72 mcg daily, Benefiber daily and MiraLAX as needed. Her symptoms persist and are associated with abdominal bloating and gas.    Labs / Imaging       Latest Ref Rng & Units 02/04/2022   12:35 PM 01/05/2018    8:47 PM  Hepatic Function  Total Protein 6.5 - 8.1 g/dL 7.5  7.0   Albumin 3.5 - 5.0 g/dL 4.4  3.9   AST 15 - 41 U/L 19  19   ALT 0 - 44 U/L 16  20   Alk Phosphatase 38 - 126 U/L 51  56   Total Bilirubin 0.3 - 1.2 mg/dL 0.7  1.1   Bilirubin, Direct 0.0 - 0.2 mg/dL <0.1  0.2        Latest Ref Rng & Units 02/04/2022   12:35 PM 01/06/2018    6:35 AM 01/05/2018   11:21 AM  CBC  WBC 4.0 - 10.5 K/uL 5.9  8.0  8.5   Hemoglobin 12.0 - 15.0 g/dL 13.2  13.7  14.5   Hematocrit 36.0 - 46.0 % 39.3  42.4  44.5   Platelets 150 - 400 K/uL 267  254  253     Current Medications, Allergies, Past Medical History, Past Surgical History, Family History and Social History were reviewed in [employer] record.   Physical Exam: General: Well developed, well nourished, elderly, no acute distress Head: Normocephalic and atraumatic Eyes: Sclerae anicteric, EOMI Ears: Normal auditory acuity Mouth: Not examined Lungs: Clear throughout to auscultation Heart: Regular rate and rhythm; no  murmurs, rubs or bruits Abdomen: Soft, mild RUQ tenderness and non distended. No masses, hepatosplenomegaly or hernias noted. Normal Bowel sounds Rectal: not done Musculoskeletal: Symmetrical with no gross deformities  Pulses:  Normal pulses noted Extremities: No clubbing, cyanosis, edema or deformities noted Neurological: Alert oriented x 4, grossly nonfocal Psychological:  Alert and cooperative. Normal mood and affect   Khushbu Pippen T. Fuller Plan, MD 06/04/2022, 9:50 AM

## 2022-06-04 NOTE — Patient Instructions (Signed)
Your provider has requested that you go to the basement level for lab work before leaving today. Press "B" on the elevator. The lab is located at the first door on the left as you exit the elevator.  Due to recent changes in healthcare laws, you may see the results of your imaging and laboratory studies on MyChart before your provider has had a chance to review them.  We understand that in some cases there may be results that are confusing or concerning to you. Not all laboratory results come back in the same time frame and the provider may be waiting for multiple results in order to interpret others.  Please give [place] 48 hours in order for your provider to thoroughly review all the results before contacting the office for clarification of your results.   Discontinue: Linzess  Increase: Miralax to 2 - 4 times daily to achieve a full bowel movement daily.  Please call our office in 1 month if you are not feeling better or continue to have issues.  Thank you for choosing me and [facility].  Pricilla Riffle. Dagoberto Ligas., MD., [facility]

## 2022-06-24 DIAGNOSIS — Z23 Encounter for immunization: Secondary | ICD-10-CM | POA: Diagnosis not present

## 2022-07-07 DIAGNOSIS — H401222 Low-tension glaucoma, left eye, moderate stage: Secondary | ICD-10-CM | POA: Diagnosis not present

## 2022-07-07 DIAGNOSIS — H401211 Low-tension glaucoma, right eye, mild stage: Secondary | ICD-10-CM | POA: Diagnosis not present

## 2022-07-07 DIAGNOSIS — H04123 Dry eye syndrome of bilateral lacrimal glands: Secondary | ICD-10-CM | POA: Diagnosis not present

## 2022-07-10 ENCOUNTER — Other Ambulatory Visit: Payer: Self-pay

## 2022-07-10 ENCOUNTER — Telehealth: Payer: Self-pay | Admitting: Gastroenterology

## 2022-07-10 DIAGNOSIS — R1011 Right upper quadrant pain: Secondary | ICD-10-CM

## 2022-07-10 DIAGNOSIS — K5904 Chronic idiopathic constipation: Secondary | ICD-10-CM

## 2022-07-10 DIAGNOSIS — R197 Diarrhea, unspecified: Secondary | ICD-10-CM

## 2022-07-10 NOTE — Telephone Encounter (Signed)
Spoke with patient regarding MD recommendations. Abd x ray ordered. Pt advised on when/where to go for xray. Pt verbalized all understanding.

## 2022-07-10 NOTE — Telephone Encounter (Signed)
Inbound call from patient stating she is not feeling any better after her last visit, says she is getting discouraged. Please advise.

## 2022-07-10 NOTE — Telephone Encounter (Signed)
She likely remains constipated with incomplete fecal evacuation. Overflow diarrhea is likely as well. Resume Miralax 2-4 times daily. Schedule 1 view abd film per her request.

## 2022-07-10 NOTE — Telephone Encounter (Signed)
Patient called in with complaints of constipation. She's having daily bowel movements, but states they are small and she is still experiencing RLQ discomfort. She was previously taking 1 capful of miralax & fiber supplement daily, and was experiencing diarrhea with incontinence so she decreased it to 1/2 capful of each. She's requesting an abdominal xray, states she doesn't feel that her bowels are cleaned out. Advised her that for some patient's the diarrhea can be from overflow diarrhea if she's experiencing constipation, and that with the combination of both miralax & fiber it can help with symptoms. She doesn't believe this to be the case. She's requesting further MD recommendations.

## 2022-07-16 ENCOUNTER — Ambulatory Visit (INDEPENDENT_AMBULATORY_CARE_PROVIDER_SITE_OTHER)
Admission: RE | Admit: 2022-07-16 | Discharge: 2022-07-16 | Disposition: A | Discharge status: Home / Self Care | Payer: Medicare Other | Source: Ambulatory Visit | Attending: Gastroenterology | Admitting: Gastroenterology

## 2022-07-16 DIAGNOSIS — R1011 Right upper quadrant pain: Secondary | ICD-10-CM

## 2022-07-16 DIAGNOSIS — R197 Diarrhea, unspecified: Secondary | ICD-10-CM | POA: Diagnosis not present

## 2022-07-16 DIAGNOSIS — K5900 Constipation, unspecified: Secondary | ICD-10-CM | POA: Diagnosis not present

## 2022-07-16 DIAGNOSIS — K5904 Chronic idiopathic constipation: Secondary | ICD-10-CM | POA: Diagnosis not present

## 2022-08-20 DIAGNOSIS — L821 Other seborrheic keratosis: Secondary | ICD-10-CM | POA: Diagnosis not present

## 2022-08-20 DIAGNOSIS — D2371 Other benign neoplasm of skin of right lower limb, including hip: Secondary | ICD-10-CM | POA: Diagnosis not present

## 2022-08-20 DIAGNOSIS — D3612 Benign neoplasm of peripheral nerves and autonomic nervous system, upper limb, including shoulder: Secondary | ICD-10-CM | POA: Diagnosis not present

## 2022-08-20 DIAGNOSIS — L814 Other melanin hyperpigmentation: Secondary | ICD-10-CM | POA: Diagnosis not present

## 2022-09-22 DIAGNOSIS — I10 Essential (primary) hypertension: Secondary | ICD-10-CM | POA: Diagnosis not present

## 2022-09-22 DIAGNOSIS — Z0000 Encounter for general adult medical examination without abnormal findings: Secondary | ICD-10-CM | POA: Diagnosis not present

## 2022-09-22 DIAGNOSIS — E7800 Pure hypercholesterolemia, unspecified: Secondary | ICD-10-CM | POA: Diagnosis not present

## 2022-09-22 DIAGNOSIS — G9520 Unspecified cord compression: Secondary | ICD-10-CM | POA: Diagnosis not present

## 2022-09-22 DIAGNOSIS — R7303 Prediabetes: Secondary | ICD-10-CM | POA: Diagnosis not present

## 2022-09-23 ENCOUNTER — Ambulatory Visit: Payer: Medicare Other | Admitting: Gastroenterology

## 2022-09-23 ENCOUNTER — Encounter: Payer: Self-pay | Admitting: Gastroenterology

## 2022-09-23 ENCOUNTER — Other Ambulatory Visit: Payer: Self-pay | Admitting: Family Medicine

## 2022-09-23 VITALS — BP 120/60 | HR 64 | Ht 61.0 in | Wt 145.0 lb

## 2022-09-23 DIAGNOSIS — K5904 Chronic idiopathic constipation: Secondary | ICD-10-CM | POA: Diagnosis not present

## 2022-09-23 DIAGNOSIS — K921 Melena: Secondary | ICD-10-CM

## 2022-09-23 DIAGNOSIS — M85852 Other specified disorders of bone density and structure, left thigh: Secondary | ICD-10-CM

## 2022-09-23 MED ORDER — NA SULFATE-K SULFATE-MG SULF 17.5-3.13-1.6 GM/177ML PO SOLN: 1.0000 | Freq: Once | ORAL | 0 refills | Status: AC

## 2022-09-23 NOTE — Progress Notes (Signed)
    Assessment     Chronic constipation associated with right sided abdominal pain Small volume hematochezia. R/O colorectal neoplasms, hemorrhoids Personal history of adenomatous colon polyp, no longer in surveillance   Recommendations    Continue MiraLAX qd and Citrucel qd.  Titrate MiraLAX qd dosing prn IBgard 1-2 tid prn for abdominal pain Schedule colonoscopy. The risks (including bleeding, perforation, infection, missed lesions, medication reactions and possible hospitalization or surgery if complications occur), benefits, and alternatives to colonoscopy with possible biopsy and possible polypectomy were discussed with the patient and they consent to proceed.     HPI    This is an 87 year old female with chronic constipation, right sided abdominal pain and small volume hematochezia.  She relates that her constipation is under fair control on daily MiraLAX and Citrucel.  She relates urgency and occasional episodes of fecal incontinence.  She has intermittently noted small amounts of bright red blood per rectum with bowel movements.    Labs / Imaging       Latest Ref Rng & Units 02/04/2022   12:35 PM 01/05/2018    8:47 PM  Hepatic Function  Total Protein 6.5 - 8.1 g/dL 7.5  7.0   Albumin 3.5 - 5.0 g/dL 4.4  3.9   AST 15 - 41 U/L 19  19   ALT 0 - 44 U/L 16  20   Alk Phosphatase 38 - 126 U/L 51  56   Total Bilirubin 0.3 - 1.2 mg/dL 0.7  1.1   Bilirubin, Direct 0.0 - 0.2 mg/dL <0.1  0.2        Latest Ref Rng & Units 02/04/2022   12:35 PM 01/06/2018    6:35 AM 01/05/2018   11:21 AM  CBC  WBC 4.0 - 10.5 K/uL 5.9  8.0  8.5   Hemoglobin 12.0 - 15.0 g/dL 13.2  13.7  14.5   Hematocrit 36.0 - 46.0 % 39.3  42.4  44.5   Platelets 150 - 400 K/uL 267  254  253    Current Medications, Allergies, Past Medical History, Past Surgical History, Family History and Social History were reviewed in [employer] record.   Physical Exam: General: Well developed, well  nourished, elderly, no acute distress Head: Normocephalic and atraumatic Eyes: Sclerae anicteric, EOMI Ears: Normal auditory acuity Mouth: No deformities or lesions noted Lungs: Clear throughout to auscultation Heart: Regular rate and rhythm; No murmurs, rubs or bruits Abdomen: Soft, non tender and non distended. No masses, hepatosplenomegaly or hernias noted. Normal Bowel sounds Rectal: No lesions, no tenderness, small external tags, decreased sphincter tone and no anal squeeze, heme negative brown stool Musculoskeletal: Symmetrical with no gross deformities  Pulses:  Normal pulses noted Extremities: No edema or deformities noted Neurological: Alert oriented x 4, grossly nonfocal Psychological:  Alert and cooperative. Normal mood and affect   Jasmine Smith T. Fuller Plan, MD 09/23/2022, 9:39 AM

## 2022-09-23 NOTE — Patient Instructions (Addendum)
You have been scheduled for a colonoscopy. Please follow written instructions given to you at your visit today.  Please pick up your prep supplies at the pharmacy within the next 1-3 days. If you use inhalers (even only as needed), please bring them with you on the day of your procedure.  The [facility] providers would like to encourage you to use [facility] to communicate with providers for non-urgent requests or questions.  Due to long hold times on the telephone, sending your provider a message by [facility] may be a faster and more efficient way to get a response.  Please allow 48 business hours for a response.  Please remember that this is for non-urgent requests.   Due to recent changes in healthcare laws, you may see the results of your imaging and laboratory studies on MyChart before your provider has had a chance to review them.  We understand that in some cases there may be results that are confusing or concerning to you. Not all laboratory results come back in the same time frame and the provider may be waiting for multiple results in order to interpret others.  Please give [place] 48 hours in order for your provider to thoroughly review all the results before contacting the office for clarification of your results.   Thank you for choosing me and [facility].  Pricilla Riffle. Dagoberto Ligas., MD., [facility]  Kegel Exercises  Kegel exercises can help strengthen your pelvic floor muscles. The pelvic floor is a group of muscles that support your rectum, small intestine, and bladder. In females, pelvic floor muscles also help support the uterus. These muscles help you control the flow of urine and stool (feces). Kegel exercises are painless and simple. They do not require any equipment. Your provider may suggest Kegel exercises to: Improve bladder and bowel control. Improve sexual response. Improve weak pelvic floor muscles after surgery to remove the uterus (hysterectomy) or after pregnancy, in  females. Improve weak pelvic floor muscles after prostate gland removal or surgery, in males. Kegel exercises involve squeezing your pelvic floor muscles. These are the same muscles you squeeze when you try to stop the flow of urine or keep from passing gas. The exercises can be done while sitting, standing, or lying down, but it is best to vary your position. Ask your health care provider which exercises are safe for you. Do exercises exactly as told by your health care provider and adjust them as directed. Do not begin these exercises until told by your health care provider. Exercises How to do Kegel exercises: Squeeze your pelvic floor muscles tight. You should feel a tight lift in your rectal area. If you are a female, you should also feel a tightness in your vaginal area. Keep your stomach, buttocks, and legs relaxed. Hold the muscles tight for up to 10 seconds. Breathe normally. Relax your muscles for up to 10 seconds. Repeat as told by your health care provider. Repeat this exercise daily as told by your health care provider. Continue to do this exercise for at least 4-6 weeks, or for as long as told by your health care provider. You may be referred to a physical therapist who can help you learn more about how to do Kegel exercises. Depending on your condition, your health care provider may recommend: Varying how long you squeeze your muscles. Doing several sets of exercises every day. Doing exercises for several weeks. Making Kegel exercises a part of your regular exercise routine. This information is not intended  to replace advice given to you by your health care provider. Make sure you discuss any questions you have with your health care provider. Document Revised: 11/22/2020 Document Reviewed: 11/22/2020 Elsevier Patient Education  [place].

## 2022-10-08 ENCOUNTER — Encounter: Payer: Self-pay | Admitting: Gastroenterology

## 2022-10-08 ENCOUNTER — Ambulatory Visit (AMBULATORY_SURGERY_CENTER): Payer: Medicare Other | Admitting: Gastroenterology

## 2022-10-08 VITALS — BP 143/47 | HR 52 | Temp 97.8°F | Resp 14 | Ht 61.0 in | Wt 145.0 lb

## 2022-10-08 DIAGNOSIS — K5904 Chronic idiopathic constipation: Secondary | ICD-10-CM

## 2022-10-08 DIAGNOSIS — K635 Polyp of colon: Secondary | ICD-10-CM | POA: Diagnosis not present

## 2022-10-08 DIAGNOSIS — D123 Benign neoplasm of transverse colon: Secondary | ICD-10-CM | POA: Diagnosis not present

## 2022-10-08 DIAGNOSIS — D124 Benign neoplasm of descending colon: Secondary | ICD-10-CM

## 2022-10-08 DIAGNOSIS — K921 Melena: Secondary | ICD-10-CM

## 2022-10-08 DIAGNOSIS — D122 Benign neoplasm of ascending colon: Secondary | ICD-10-CM

## 2022-10-08 MED ORDER — SODIUM CHLORIDE 0.9 % IV SOLN: 500.0000 mL | Freq: Once | INTRAVENOUS | Status: DC

## 2022-10-08 NOTE — Progress Notes (Signed)
Pt's states no medical or surgical changes since previsit or office visit. 

## 2022-10-08 NOTE — Op Note (Signed)
[place] Patient Name: Jasmine Smith Procedure Date: 10/08/2022 1:53 PM MRN: [id number] Endoscopist: Ladene Artist , MD, [id number] Age: 87 Referring MD:  Date of Birth: 31-Mar-1936 Gender: Female Account #: [id number] Procedure:                Colonoscopy Indications:              Hematochezia, Constipation Medicines:                Monitored Anesthesia Care Procedure:                Pre-Anesthesia Assessment:                           - Prior to the procedure, a History and Physical                            was performed, and patient medications and                            allergies were reviewed. The patient's tolerance of                            previous anesthesia was also reviewed. The risks                            and benefits of the procedure and the sedation                            options and risks were discussed with the patient.                            All questions were answered, and informed consent                            was obtained. Prior Anticoagulants: The patient has                            taken no anticoagulant or antiplatelet agents. ASA                            Grade Assessment: III - A patient with severe                            systemic disease. After reviewing the risks and                            benefits, the patient was deemed in satisfactory                            condition to undergo the procedure.                           After obtaining informed consent, the colonoscope  was passed under direct vision. Throughout the                            procedure, the patient's blood pressure, pulse, and                            oxygen saturations were monitored continuously.The                            ileocecal valve, appendiceal orifice, and rectum                            were photographed. The quality of the bowel                            preparation was good. The colonoscopy  was performed                            without difficulty. The patient tolerated the                            procedure well. The Olympus [id number]                            Colonoscope was introduced through the anus and                            advanced to the the cecum, identified by                            appendiceal orifice and ileocecal valve. Scope In: 2:07:46 PM Scope Out: 2:29:50 PM Scope Withdrawal Time: 0 hours 18 minutes 2 seconds  Total Procedure Duration: 0 hours 22 minutes 4 seconds  Findings:                 The perianal and digital rectal examinations were                            normal.                           Seven sessile polyps were found in the descending                            colon, transverse colon (4) and ascending colon                            (2). The polyps were 6 to 10 mm in size. These                            polyps were removed with a cold snare. Resection                            and retrieval were complete.  Multiple medium-mouthed diverticula were found in                            the left colon. There was narrowing of the colon in                            association with the diverticular opening. There                            was evidence of diverticular spasm. There was no                            evidence of diverticular bleeding.                           Internal hemorrhoids were found during                            retroflexion. The hemorrhoids were small and Grade                            I (internal hemorrhoids that do not prolapse).                           The exam was otherwise without abnormality on                            direct and retroflexion views. Complications:            No immediate complications. Estimated blood loss:                            None. Estimated Blood Loss:     Estimated blood loss: none. Impression:               - Seven 6 to 10  mm polyps in the descending colon,                            in the transverse colon and in the ascending colon,                            removed with a cold snare. Resected and retrieved.                           - Moderate diverticulosis in the left colon.                           - Internal hemorrhoids.                           - The examination was otherwise normal on direct                            and retroflexion views. Recommendation:           - Patient has  a contact number available for                            emergencies. The signs and symptoms of potential                            delayed complications were discussed with the                            patient. Return to normal activities tomorrow.                            Written discharge instructions were provided to the                            patient.                           - Resume previous diet.                           - Continue present medications.                           - Await pathology results.                           - No repeat colonoscopy due to age. Ladene Artist, MD 10/08/2022 2:36:23 PM This report has been signed electronically.

## 2022-10-08 NOTE — Progress Notes (Signed)
See 09/23/2022 H&P, no changes 

## 2022-10-08 NOTE — Progress Notes (Signed)
Called to room to assist during endoscopic procedure.  Patient ID and intended procedure confirmed with present staff. Received instructions for my participation in the procedure from the performing physician.  

## 2022-10-08 NOTE — Progress Notes (Signed)
Sedate, gd SR's, VSS, report to RN 

## 2022-10-08 NOTE — Patient Instructions (Signed)
-  Handout on polyps, diverticulosis and hemorrhoids provided -await pathology results -no repeat colonoscopy due to age.   -Continue present medications    YOU HAD AN ENDOSCOPIC PROCEDURE TODAY AT [facility]:   Refer to the procedure report that was given to you for any specific questions about what was found during the examination.  If the procedure report does not answer your questions, please call your gastroenterologist to clarify.  If you requested that your care partner not be given the details of your procedure findings, then the procedure report has been included in a sealed envelope for you to review at your convenience later.  YOU SHOULD EXPECT: Some feelings of bloating in the abdomen. Passage of more gas than usual.  Walking can help get rid of the air that was put into your GI tract during the procedure and reduce the bloating. If you had a lower endoscopy (such as a colonoscopy or flexible sigmoidoscopy) you may notice spotting of blood in your stool or on the toilet paper. If you underwent a bowel prep for your procedure, you may not have a normal bowel movement for a few days.  Please Note:  You might notice some irritation and congestion in your nose or some drainage.  This is from the oxygen used during your procedure.  There is no need for concern and it should clear up in a day or so.  SYMPTOMS TO REPORT IMMEDIATELY:  Following lower endoscopy (colonoscopy or flexible sigmoidoscopy):  Excessive amounts of blood in the stool  Significant tenderness or worsening of abdominal pains  Swelling of the abdomen that is new, acute  Fever of 100F or higher  For urgent or emergent issues, a gastroenterologist can be reached at any hour by calling [phone]. Do not use MyChart messaging for urgent concerns.    DIET:  We do recommend a small meal at first, but then you may proceed to your regular diet.  Drink plenty of fluids but you should avoid alcoholic  beverages for 24 hours.  ACTIVITY:  You should plan to take it easy for the rest of today and you should NOT DRIVE or use heavy machinery until tomorrow (because of the sedation medicines used during the test).    FOLLOW UP: Our staff will call the number listed on your records the next business day following your procedure.  We will call around 7:15- 8:00 am to check on you and address any questions or concerns that you may have regarding the information given to you following your procedure. If we do not reach you, we will leave a message.     If any biopsies were taken you will be contacted by phone or by letter within the next 1-3 weeks.  Please call us at [phone] if you have not heard about the biopsies in 3 weeks.    SIGNATURES/CONFIDENTIALITY: You and/or your care partner have signed paperwork which will be entered into your electronic medical record.  These signatures attest to the fact that that the information above on your After Visit Summary has been reviewed and is understood.  Full responsibility of the confidentiality of this discharge information lies with you and/or your care-partner.

## 2022-10-09 ENCOUNTER — Telehealth: Payer: Self-pay | Admitting: *Deleted

## 2022-10-09 NOTE — Telephone Encounter (Signed)
  Follow up Call-     10/08/2022    1:08 PM  Call back number  Post procedure Call Back phone  # [phone]  Permission to leave phone message Yes     Patient questions:  Message left to call us if necessary.

## 2022-10-15 ENCOUNTER — Encounter: Payer: Self-pay | Admitting: Gastroenterology

## 2022-10-23 DIAGNOSIS — M67911 Unspecified disorder of synovium and tendon, right shoulder: Secondary | ICD-10-CM | POA: Diagnosis not present

## 2022-10-23 DIAGNOSIS — M25511 Pain in right shoulder: Secondary | ICD-10-CM | POA: Diagnosis not present

## 2022-11-12 DIAGNOSIS — H04123 Dry eye syndrome of bilateral lacrimal glands: Secondary | ICD-10-CM | POA: Diagnosis not present

## 2022-11-12 DIAGNOSIS — H401222 Low-tension glaucoma, left eye, moderate stage: Secondary | ICD-10-CM | POA: Diagnosis not present

## 2022-11-12 DIAGNOSIS — H401211 Low-tension glaucoma, right eye, mild stage: Secondary | ICD-10-CM | POA: Diagnosis not present

## 2022-11-13 DIAGNOSIS — M75121 Complete rotator cuff tear or rupture of right shoulder, not specified as traumatic: Secondary | ICD-10-CM | POA: Diagnosis not present

## 2022-11-17 ENCOUNTER — Other Ambulatory Visit: Payer: Self-pay | Admitting: Orthopedic Surgery

## 2022-11-17 DIAGNOSIS — M25511 Pain in right shoulder: Secondary | ICD-10-CM

## 2022-12-07 ENCOUNTER — Other Ambulatory Visit: Payer: Medicare Other

## 2022-12-16 ENCOUNTER — Other Ambulatory Visit: Payer: Medicare Other

## 2023-01-01 DIAGNOSIS — M25511 Pain in right shoulder: Secondary | ICD-10-CM | POA: Diagnosis not present

## 2023-01-05 DIAGNOSIS — K080 Exfoliation of teeth due to systemic causes: Secondary | ICD-10-CM | POA: Diagnosis not present

## 2023-01-06 DIAGNOSIS — M75121 Complete rotator cuff tear or rupture of right shoulder, not specified as traumatic: Secondary | ICD-10-CM | POA: Diagnosis not present

## 2023-01-06 DIAGNOSIS — M25511 Pain in right shoulder: Secondary | ICD-10-CM | POA: Diagnosis not present

## 2023-01-07 DIAGNOSIS — M75121 Complete rotator cuff tear or rupture of right shoulder, not specified as traumatic: Secondary | ICD-10-CM | POA: Diagnosis not present

## 2023-01-13 ENCOUNTER — Other Ambulatory Visit: Payer: Self-pay | Admitting: Orthopedic Surgery

## 2023-01-14 ENCOUNTER — Other Ambulatory Visit: Payer: Medicare Other

## 2023-01-19 DIAGNOSIS — F411 Generalized anxiety disorder: Secondary | ICD-10-CM | POA: Diagnosis not present

## 2023-01-26 NOTE — Progress Notes (Signed)
COVID Vaccine received:  []  No [x]  Yes Date of any COVID positive Test in last 65 days:No  PCP - Johny Blamer MD Cardiologist - No  Chest x-ray - 01/27/23 EKG -01/27/23   Stress Test -  ECHO - 01/06/18  EPIC Cardiac Cath - no  Bowel Prep - [x]  No  []   Yes ______  Pacemaker / ICD device [x]  No []  Yes   Spinal Cord Stimulator:[x]  No []  Yes       History of Sleep Apnea? [x]  No []  Yes   CPAP used?- [x]  No []  Yes    Does the patient monitor blood sugar?          [x]  No []  Yes  []  N/A  Patient has: [x]  NO Hx DM   []  Pre-DM                 []  DM1  []   DM2 Does patient have a [employer] or Dexacom? []  No []  Yes   Fasting Blood Sugar Ranges-  Checks Blood Sugar __0___ times a day  GLP1 agonist / usual dose - No GLP1 instructions:  SGLT-2 inhibitors / usual dose - No SGLT-2 instructions:   Blood Thinner / Instructions:No Aspirin Instructions:2 (81mg  )ASA/ Day Will stop 5 days prior to surgery. Comments:   Activity level: Patient is able  to climb a flight of stairs without difficulty; [x]  No CP  [x]  No SOB, but would have ___   Patient can perform ADLs without assistance.   Anesthesia review: Hx. CVA, PVC's, HTN, Low HR  Patient denies shortness of breath, fever, cough and chest pain at PAT appointment.  Patient verbalized understanding and agreement to the Pre-Surgical Instructions that were given to them at this PAT appointment. Patient was also educated of the need to review these PAT instructions again prior to his/her surgery.I reviewed the appropriate phone numbers to call if they have any and questions or concerns.

## 2023-01-26 NOTE — Patient Instructions (Signed)
SURGICAL WAITING ROOM VISITATION  Patients having surgery or a procedure may have no more than 2 support people in the waiting area - these visitors may rotate.    Children under the age of 66 must have an adult with them who is not the patient.  Due to an increase in RSV and influenza rates and associated hospitalizations, children ages 56 and under may not visit patients in [facility] hospitals.  If the patient needs to stay at the hospital during part of their recovery, the visitor guidelines for inpatient rooms apply. Pre-op nurse will coordinate an appropriate time for 1 support person to accompany patient in pre-op.  This support person may not rotate.    Please refer to the [facility] website for the visitor guidelines for Inpatients (after your surgery is over and you are in a regular room).       Your procedure is scheduled on: 02/05/23   Report to [facility] Main Entrance    Report to admitting at 5:15 AM   Call this number if you have problems the morning of surgery [phone]   Do not eat food :After Midnight.   After Midnight you may have the following liquids until 4:30 AM DAY OF SURGERY  Water Non-Citrus Juices (without pulp, NO RED-Apple, White grape, White cranberry) Black Coffee (NO MILK/CREAM OR CREAMERS, sugar ok)  Clear Tea (NO MILK/CREAM OR CREAMERS, sugar ok) regular and decaf                             Plain Jell-O (NO RED)                                           Fruit ices (not with fruit pulp, NO RED)                                     Popsicles (NO RED)                                                               Sports drinks like Gatorade (NO RED)                  The day of surgery:  Drink ONE (1) Pre-Surgery Clear Ensure at 4:30 AM the morning of surgery. Drink in one sitting. Do not sip.  This drink was given to you during your hospital  pre-op appointment visit. Nothing else to drink after completing the  Pre-Surgery Clear  Ensure      Oral Hygiene is also important to reduce your risk of infection.                                    Remember - BRUSH YOUR TEETH THE MORNING OF SURGERY WITH YOUR REGULAR TOOTHPASTE  DENTURES WILL BE REMOVED PRIOR TO SURGERY PLEASE DO NOT APPLY "Poly grip" OR ADHESIVES!!!   Do NOT smoke after Midnight   Take these medicines the morning of surgery with  A SIP OF WATER: Amlodipine, Esomeprazole, Gabapentin, Synthroid, Lorazepam, Metoprolol,Rosuvastatin.             You may not have any metal on your body including hair pins, jewelry, and body piercing             Do not wear make-up, lotions, powders, perfumes or deodorant  Do not wear nail polish including gel and S&S, artificial/acrylic nails, or any other type of covering on natural nails including finger and toenails. If you have artificial nails, gel coating, etc. that needs to be removed by a nail salon please have this removed prior to surgery or surgery may need to be canceled/ delayed if the surgeon/ anesthesia feels like they are unable to be safely monitored.   Do not shave  48 hours prior to surgery.    Do not bring valuables to the hospital. [facility] IS NOT             RESPONSIBLE   FOR VALUABLES.   Contacts, glasses, dentures or bridgework may not be worn into surgery.   Bring small overnight bag day of surgery.   DO NOT BRING YOUR HOME MEDICATIONS TO THE HOSPITAL. PHARMACY WILL DISPENSE MEDICATIONS LISTED ON YOUR MEDICATION LIST TO YOU DURING YOUR ADMISSION IN THE HOSPITAL!    Patients discharged on the day of surgery will not be allowed to drive home.  Someone NEEDS to stay with you for the first 24 hours after anesthesia.   Special Instructions: Bring a copy of your healthcare power of attorney and living will documents the day of surgery if you haven't scanned them before.              Please read over the following fact sheets you were given: IF YOU HAVE QUESTIONS ABOUT YOUR PRE-OP INSTRUCTIONS PLEASE  CALL [phone]   If you received a COVID test during your pre-op visit  it is requested that you wear a mask when out in public, stay away from anyone that may not be feeling well and notify your surgeon if you develop symptoms. If you test positive for Covid or have been in contact with anyone that has tested positive in the last 10 days please notify you surgeon.      Pre-operative 5 CHG Bath Instructions   You can play a key role in reducing the risk of infection after surgery. Your skin needs to be as free of germs as possible. You can reduce the number of germs on your skin by washing with CHG (chlorhexidine gluconate) soap before surgery. CHG is an antiseptic soap that kills germs and continues to kill germs even after washing.   DO NOT use if you have an allergy to chlorhexidine/CHG or antibacterial soaps. If your skin becomes reddened or irritated, stop using the CHG and notify one of our RNs at [phone].   Please shower with the CHG soap starting 4 days before surgery using the following schedule:     Please keep in mind the following:  DO NOT shave, including legs and underarms, starting the day of your first shower.   You may shave your face at any point before/day of surgery.  Place clean sheets on your bed the day you start using CHG soap. Use a clean washcloth (not used since being washed) for each shower. DO NOT sleep with pets once you start using the CHG.   CHG Shower Instructions:  If you choose to wash your hair and private area, wash first with  your normal shampoo/soap.  After you use shampoo/soap, rinse your hair and body thoroughly to remove shampoo/soap residue.  Turn the water OFF and apply about 3 tablespoons (45 ml) of CHG soap to a CLEAN washcloth.  Apply CHG soap ONLY FROM YOUR NECK DOWN TO YOUR TOES (washing for 3-5 minutes)  DO NOT use CHG soap on face, private areas, open wounds, or sores.  Pay special attention to the area where your surgery is  being performed.  If you are having back surgery, having someone wash your back for you may be helpful. Wait 2 minutes after CHG soap is applied, then you may rinse off the CHG soap.  Pat dry with a clean towel  Put on clean clothes/pajamas   If you choose to wear lotion, please use ONLY the CHG-compatible lotions on the back of this paper.     Additional instructions for the day of surgery: DO NOT APPLY any lotions, deodorants, cologne, or perfumes.   Put on clean/comfortable clothes.  Brush your teeth.  Ask your nurse before applying any prescription medications to the skin.      CHG Compatible Lotions   Aveeno Moisturizing lotion  Cetaphil Moisturizing Cream  Cetaphil Moisturizing Lotion  Clairol Herbal Essence Moisturizing Lotion, Dry Skin  Clairol Herbal Essence Moisturizing Lotion, Extra Dry Skin  Clairol Herbal Essence Moisturizing Lotion, Normal Skin  Curel Age Defying Therapeutic Moisturizing Lotion with Alpha Hydroxy  Curel Extreme Care Body Lotion  Curel Soothing Hands Moisturizing Hand Lotion  Curel Therapeutic Moisturizing Cream, Fragrance-Free  Curel Therapeutic Moisturizing Lotion, Fragrance-Free  Curel Therapeutic Moisturizing Lotion, Original Formula  Eucerin Daily Replenishing Lotion  Eucerin Dry Skin Therapy Plus Alpha Hydroxy Crme  Eucerin Dry Skin Therapy Plus Alpha Hydroxy Lotion  Eucerin Original Crme  Eucerin Original Lotion  Eucerin Plus Crme Eucerin Plus Lotion  Eucerin TriLipid Replenishing Lotion  Keri Anti-Bacterial Hand Lotion  Keri Deep Conditioning Original Lotion Dry Skin Formula Softly Scented  Keri Deep Conditioning Original Lotion, Fragrance Free Sensitive Skin Formula  Keri Lotion Fast Absorbing Fragrance Free Sensitive Skin Formula  Keri Lotion Fast Absorbing Softly Scented Dry Skin Formula  Keri Original Lotion  Keri Skin Renewal Lotion Keri Silky Smooth Lotion  Keri Silky Smooth Sensitive Skin Lotion  Nivea Body Creamy  Conditioning Oil  Nivea Body Extra Enriched Lotion  Nivea Body Original Lotion  Nivea Body Sheer Moisturizing Lotion Nivea Crme  Nivea Skin Firming Lotion  NutraDerm 30 Skin Lotion  NutraDerm Skin Lotion  NutraDerm Therapeutic Skin Cream  NutraDerm Therapeutic Skin Lotion  ProShield Protective Hand Cream  Provon moisturizing lotion [place]- Preparing for Total Shoulder Arthroplasty    Before surgery, you can play an important role. Because skin is not sterile, your skin needs to be as free of germs as possible. You can reduce the number of germs on your skin by using the following products. Benzoyl Peroxide Gel Reduces the number of germs present on the skin Applied twice a day to shoulder area starting two days before surgery    ==================================================================  Please follow these instructions carefully:  BENZOYL PEROXIDE 5% GEL  Please do not use if you have an allergy to benzoyl peroxide.   If your skin becomes reddened/irritated stop using the benzoyl peroxide.  Starting two days before surgery, apply as follows: Apply benzoyl peroxide in the morning and at night. Apply after taking a shower. If you are not taking a shower clean entire shoulder front, back, and side along with the armpit with  a clean wet washcloth.  Place a quarter-sized dollop on your shoulder and rub in thoroughly, making sure to cover the front, back, and side of your shoulder, along with the armpit.   2 days before ____ AM   ____ PM              1 day before ____ AM   ____ PM                         Do this twice a day for two days.  (Last application is the night before surgery, AFTER using the CHG soap as described below).  Do NOT apply benzoyl peroxide gel on the day of surgery.    Incentive Spirometer  (Watch this video at home: [email or web address])    An incentive spirometer is a tool that can help keep your lungs clear and  active. This tool measures how well you are filling your lungs with each breath. Taking long deep breaths may help reverse or decrease the chance of developing breathing (pulmonary) problems (especially infection) following:  A long period of time when you are unable to move or be active.  BEFORE THE PROCEDURE   If the spirometer includes an indicator to show your best effort, your nurse or respiratory therapist will set it to a desired goal.  If possible, sit up straight or lean slightly forward. Try not to slouch.  Hold the incentive spirometer in an upright position.  INSTRUCTIONS FOR USE  Sit on the edge of your bed if possible, or sit up as far as you can in bed or on a chair. Hold the incentive spirometer in an upright position. Breathe out normally. Place the mouthpiece in your mouth and seal your lips tightly around it. Breathe in slowly and as deeply as possible, raising the piston or the ball toward the top of the column. Hold your breath for 3-5 seconds or for as long as possible. Allow the piston or ball to fall to the bottom of the column. Remove the mouthpiece from your mouth and breathe out normally. Rest for a few seconds and repeat Steps 1 through 7 at least 10 times every 1-2 hours when you are awake. Take your time and take a few normal breaths between deep breaths. The spirometer may include an indicator to show your best effort. Use the indicator as a goal to work toward during each repetition. After each set of 10 deep breaths, practice coughing to be sure your lungs are clear. If you have an incision (the cut made at the time of surgery), support your incision when coughing by placing a pillow or rolled up towels firmly against it. Once you are able to get out of bed, walk around indoors and cough well. You may stop using the incentive spirometer when instructed by your caregiver.  RISKS AND COMPLICATIONS Take your time so you do not get dizzy or light-headed. If you  are in pain, you may need to take or ask for pain medication before doing incentive spirometry. It is harder to take a deep breath if you are having pain. AFTER USE Rest and breathe slowly and easily. It can be helpful to keep track of a log of your progress. Your caregiver can provide you with a simple table to help with this. If you are using the spirometer at home, follow these instructions: SEEK MEDICAL CARE IF:  You are having difficultly using the spirometer. You have trouble using  the spirometer as often as instructed. Your pain medication is not giving enough relief while using the spirometer. You develop fever of 100.5 F (38.1 C) or higher. SEEK IMMEDIATE MEDICAL CARE IF:  You cough up bloody sputum that had not been present before. You develop fever of 102 F (38.9 C) or greater. You develop worsening pain at or near the incision site. MAKE SURE YOU:  Understand these instructions. Will watch your condition. Will get help right away if you are not doing well or get worse.

## 2023-01-27 ENCOUNTER — Other Ambulatory Visit: Payer: Self-pay

## 2023-01-27 ENCOUNTER — Encounter (HOSPITAL_COMMUNITY)
Admission: RE | Admit: 2023-01-27 | Discharge: 2023-01-27 | Disposition: A | Discharge status: Home / Self Care | Payer: Medicare Other | Source: Ambulatory Visit | Attending: Orthopedic Surgery | Admitting: Orthopedic Surgery

## 2023-01-27 ENCOUNTER — Ambulatory Visit (HOSPITAL_COMMUNITY)
Admission: RE | Admit: 2023-01-27 | Discharge: 2023-01-27 | Disposition: A | Discharge status: Home / Self Care | Payer: Medicare Other | Source: Ambulatory Visit | Attending: Orthopedic Surgery | Admitting: Orthopedic Surgery

## 2023-01-27 ENCOUNTER — Encounter (HOSPITAL_COMMUNITY): Payer: Self-pay

## 2023-01-27 VITALS — BP 142/54 | HR 55 | Temp 97.4°F | Resp 18 | Ht 62.0 in | Wt 145.0 lb

## 2023-01-27 DIAGNOSIS — Z01818 Encounter for other preprocedural examination: Secondary | ICD-10-CM | POA: Diagnosis not present

## 2023-01-27 DIAGNOSIS — I10 Essential (primary) hypertension: Secondary | ICD-10-CM | POA: Diagnosis not present

## 2023-01-27 HISTORY — DX: Hypothyroidism, unspecified: E03.9

## 2023-01-27 HISTORY — DX: Unspecified osteoarthritis, unspecified site: M19.90

## 2023-01-27 LAB — BASIC METABOLIC PANEL
Anion gap: 7 (ref 5–15)
BUN: 16 mg/dL (ref 8–23)
CO2: 29 mmol/L (ref 22–32)
Calcium: 9.5 mg/dL (ref 8.9–10.3)
Chloride: 102 mmol/L (ref 98–111)
Creatinine, Ser: 0.83 mg/dL (ref 0.44–1.00)
GFR, Estimated: >60 mL/min (ref >60)
Glucose, Bld: 113 mg/dL — ABNORMAL HIGH (ref 70–99)
Potassium: 4.2 mmol/L (ref 3.5–5.1)
Sodium: 138 mmol/L (ref 135–145)

## 2023-01-27 LAB — CBC
HCT: 42.8 % (ref 36.0–46.0)
Hemoglobin: 13.8 g/dL (ref 12.0–15.0)
MCH: 30.5 pg (ref 26.0–34.0)
MCHC: 32.2 g/dL (ref 30.0–36.0)
MCV: 94.5 fL (ref 80.0–100.0)
Platelets: 233 10*3/uL (ref 150–400)
RBC: 4.53 MIL/uL (ref 3.87–5.11)
RDW: 12.8 % (ref 11.5–15.5)
WBC: 6.3 10*3/uL (ref 4.0–10.5)
nRBC: 0 % (ref 0.0–0.2)

## 2023-01-27 LAB — SURGICAL PCR SCREEN
MRSA, PCR: NEGATIVE
Staphylococcus aureus: NEGATIVE

## 2023-02-04 NOTE — Anesthesia Preprocedure Evaluation (Signed)
Anesthesia Evaluation  Patient identified by MRN, date of birth, ID band Patient awake    Reviewed: Allergy & Precautions, NPO status , Patient's Chart, lab work & pertinent test results  Airway Mallampati: II  TM Distance: >3 FB Neck ROM: Full    Dental no notable dental hx. (+) Teeth Intact, Dental Advisory Given   Pulmonary    Pulmonary exam normal breath sounds clear to auscultation       Cardiovascular hypertension, Normal cardiovascular exam Rhythm:Regular Rate:Normal     Neuro/Psych   Anxiety     negative neurological ROS     GI/Hepatic Neg liver ROS,GERD  Medicated and Controlled,,  Endo/Other  Hypothyroidism    Renal/GU Lab Results      Component                Value               Date                      CREATININE               0.83                01/27/2023                    K                        4.2                 01/27/2023                    Musculoskeletal  (+) Arthritis , Osteoarthritis,    Abdominal   Peds  Hematology Lab Results      Component                Value               Date                      WBC                      6.3                 01/27/2023                HGB                      13.8                01/27/2023                HCT                      42.8                01/27/2023                PLT                      233                 01/27/2023              Anesthesia Other Findings ALL: Tramadol, metronidazole, Levaquin, Augmentin, clarithromycin  Reproductive/Obstetrics  Anesthesia Physical Anesthesia Plan  ASA: 3  Anesthesia Plan: General   Post-op Pain Management: Regional block*, Minimal or no pain anticipated and Precedex   Induction: Intravenous  PONV Risk Score and Plan: 4 or greater and Treatment may vary due to age or medical condition and Ondansetron  Airway Management Planned: Oral  ETT  Additional Equipment: None  Intra-op Plan:   Post-operative Plan: Extubation in OR  Informed Consent: I have reviewed the patients History and Physical, chart, labs and discussed the procedure including the risks, benefits and alternatives for the proposed anesthesia with the patient or authorized representative who has indicated his/her understanding and acceptance.     Dental advisory given  Plan Discussed with: CRNA  Anesthesia Plan Comments: (GA w R ISB)        Anesthesia Quick Evaluation

## 2023-02-05 ENCOUNTER — Other Ambulatory Visit: Payer: Self-pay

## 2023-02-05 ENCOUNTER — Ambulatory Visit (HOSPITAL_BASED_OUTPATIENT_CLINIC_OR_DEPARTMENT_OTHER): Payer: Medicare Other | Admitting: Certified Registered Nurse Anesthetist

## 2023-02-05 ENCOUNTER — Encounter (HOSPITAL_COMMUNITY)
Admission: RE | Disposition: A | Discharge status: Home / Self Care | Payer: Self-pay | Source: Home / Self Care | Attending: Orthopedic Surgery

## 2023-02-05 ENCOUNTER — Ambulatory Visit (HOSPITAL_COMMUNITY): Payer: Self-pay | Admitting: Physician Assistant

## 2023-02-05 ENCOUNTER — Encounter (HOSPITAL_COMMUNITY): Payer: Self-pay | Admitting: Orthopedic Surgery

## 2023-02-05 ENCOUNTER — Observation Stay (HOSPITAL_COMMUNITY)
Admission: RE | Admit: 2023-02-05 | Discharge: 2023-02-06 | Disposition: A | Discharge status: Home / Self Care | Payer: Medicare Other | Attending: Orthopedic Surgery | Admitting: Orthopedic Surgery

## 2023-02-05 DIAGNOSIS — Z96611 Presence of right artificial shoulder joint: Principal | ICD-10-CM

## 2023-02-05 DIAGNOSIS — G8918 Other acute postprocedural pain: Secondary | ICD-10-CM | POA: Diagnosis not present

## 2023-02-05 DIAGNOSIS — E039 Hypothyroidism, unspecified: Secondary | ICD-10-CM | POA: Insufficient documentation

## 2023-02-05 DIAGNOSIS — M25811 Other specified joint disorders, right shoulder: Secondary | ICD-10-CM | POA: Diagnosis not present

## 2023-02-05 DIAGNOSIS — M75101 Unspecified rotator cuff tear or rupture of right shoulder, not specified as traumatic: Secondary | ICD-10-CM | POA: Diagnosis not present

## 2023-02-05 DIAGNOSIS — F419 Anxiety disorder, unspecified: Secondary | ICD-10-CM | POA: Diagnosis not present

## 2023-02-05 DIAGNOSIS — Z7982 Long term (current) use of aspirin: Secondary | ICD-10-CM | POA: Insufficient documentation

## 2023-02-05 DIAGNOSIS — E785 Hyperlipidemia, unspecified: Secondary | ICD-10-CM

## 2023-02-05 DIAGNOSIS — I10 Essential (primary) hypertension: Secondary | ICD-10-CM | POA: Insufficient documentation

## 2023-02-05 DIAGNOSIS — M75121 Complete rotator cuff tear or rupture of right shoulder, not specified as traumatic: Secondary | ICD-10-CM | POA: Diagnosis not present

## 2023-02-05 DIAGNOSIS — Z8673 Personal history of transient ischemic attack (TIA), and cerebral infarction without residual deficits: Secondary | ICD-10-CM | POA: Insufficient documentation

## 2023-02-05 DIAGNOSIS — Z79899 Other long term (current) drug therapy: Secondary | ICD-10-CM | POA: Diagnosis not present

## 2023-02-05 HISTORY — PX: REVERSE SHOULDER ARTHROPLASTY: SHX5054

## 2023-02-05 SURGERY — ARTHROPLASTY, SHOULDER, TOTAL, REVERSE
Anesthesia: General | Site: Shoulder | Laterality: Right

## 2023-02-05 MED ORDER — METOPROLOL SUCCINATE ER 25 MG PO TB24
12.5000 mg | ORAL_TABLET | Freq: Every day | ORAL | Status: DC
  Filled 2023-02-05: qty 1

## 2023-02-05 MED ORDER — ROSUVASTATIN CALCIUM 5 MG PO TABS
5.0000 mg | ORAL_TABLET | Freq: Every day | ORAL | Status: DC
  Administered 2023-02-05: 5 mg via ORAL
  Filled 2023-02-05: qty 1

## 2023-02-05 MED ORDER — ONDANSETRON HCL 4 MG/2ML IJ SOLN
INTRAMUSCULAR | Status: DC | PRN
  Administered 2023-02-05: 4 mg via INTRAVENOUS

## 2023-02-05 MED ORDER — LORAZEPAM 0.5 MG PO TABS
0.5000 mg | ORAL_TABLET | Freq: Two times a day (BID) | ORAL | Status: DC
  Administered 2023-02-05 – 2023-02-06 (×2): 0.5 mg via ORAL
  Filled 2023-02-05 (×2): qty 1

## 2023-02-05 MED ORDER — DEXAMETHASONE SODIUM PHOSPHATE 10 MG/ML IJ SOLN
INTRAMUSCULAR | Status: AC
  Filled 2023-02-05: qty 1

## 2023-02-05 MED ORDER — ACETAMINOPHEN 325 MG PO TABS: 325.0000 mg | ORAL_TABLET | Freq: Four times a day (QID) | ORAL | Status: DC | PRN

## 2023-02-05 MED ORDER — ORAL CARE MOUTH RINSE: 15.0000 mL | Freq: Once | OROMUCOSAL | Status: AC

## 2023-02-05 MED ORDER — ROCURONIUM BROMIDE 10 MG/ML (PF) SYRINGE
PREFILLED_SYRINGE | INTRAVENOUS | Status: AC
  Filled 2023-02-05: qty 10

## 2023-02-05 MED ORDER — LIDOCAINE HCL (PF) 2 % IJ SOLN
INTRAMUSCULAR | Status: AC
  Filled 2023-02-05: qty 5

## 2023-02-05 MED ORDER — GABAPENTIN 300 MG PO CAPS
300.0000 mg | ORAL_CAPSULE | Freq: Three times a day (TID) | ORAL | Status: DC
  Administered 2023-02-05 – 2023-02-06 (×3): 300 mg via ORAL
  Filled 2023-02-05 (×3): qty 1

## 2023-02-05 MED ORDER — EPHEDRINE 5 MG/ML INJ
INTRAVENOUS | Status: AC
  Filled 2023-02-05: qty 5

## 2023-02-05 MED ORDER — SUGAMMADEX SODIUM 200 MG/2ML IV SOLN
INTRAVENOUS | Status: DC | PRN
  Administered 2023-02-05: 200 mg via INTRAVENOUS

## 2023-02-05 MED ORDER — OXYCODONE HCL 5 MG PO TABS: 10.0000 mg | ORAL_TABLET | ORAL | Status: DC | PRN

## 2023-02-05 MED ORDER — CHLORHEXIDINE GLUCONATE 0.12 % MT SOLN
15.0000 mL | Freq: Once | OROMUCOSAL | Status: AC
  Administered 2023-02-05: 15 mL via OROMUCOSAL

## 2023-02-05 MED ORDER — FENTANYL CITRATE (PF) 100 MCG/2ML IJ SOLN
INTRAMUSCULAR | Status: AC
  Filled 2023-02-05: qty 2

## 2023-02-05 MED ORDER — FLEET ENEMA 7-19 GM/118ML RE ENEM: 1.0000 | ENEMA | Freq: Once | RECTAL | Status: DC | PRN

## 2023-02-05 MED ORDER — SODIUM CHLORIDE 0.9 % IR SOLN
Status: DC | PRN
  Administered 2023-02-05: 1000 mL

## 2023-02-05 MED ORDER — METHOCARBAMOL 500 MG IVPB - SIMPLE MED: 500.0000 mg | Freq: Four times a day (QID) | INTRAVENOUS | Status: DC | PRN

## 2023-02-05 MED ORDER — PHENYLEPHRINE HCL-NACL 20-0.9 MG/250ML-% IV SOLN
INTRAVENOUS | Status: DC | PRN
  Administered 2023-02-05: 35 ug/min via INTRAVENOUS

## 2023-02-05 MED ORDER — ONDANSETRON HCL 4 MG PO TABS: 4.0000 mg | ORAL_TABLET | Freq: Four times a day (QID) | ORAL | Status: DC | PRN

## 2023-02-05 MED ORDER — DOCUSATE SODIUM 100 MG PO CAPS
100.0000 mg | ORAL_CAPSULE | Freq: Two times a day (BID) | ORAL | Status: DC
  Administered 2023-02-05 – 2023-02-06 (×3): 100 mg via ORAL
  Filled 2023-02-05 (×3): qty 1

## 2023-02-05 MED ORDER — BUPIVACAINE LIPOSOME 1.3 % IJ SUSP
INTRAMUSCULAR | Status: DC | PRN
  Administered 2023-02-05: 10 mL via PERINEURAL

## 2023-02-05 MED ORDER — PROPOFOL 10 MG/ML IV BOLUS
INTRAVENOUS | Status: AC
  Filled 2023-02-05: qty 20

## 2023-02-05 MED ORDER — ACETAMINOPHEN 500 MG PO TABS
1000.0000 mg | ORAL_TABLET | Freq: Four times a day (QID) | ORAL | Status: AC
  Administered 2023-02-05 – 2023-02-06 (×4): 1000 mg via ORAL
  Filled 2023-02-05 (×4): qty 2

## 2023-02-05 MED ORDER — ONDANSETRON HCL 4 MG/2ML IJ SOLN: 4.0000 mg | Freq: Four times a day (QID) | INTRAMUSCULAR | Status: DC | PRN

## 2023-02-05 MED ORDER — LOSARTAN POTASSIUM 50 MG PO TABS
100.0000 mg | ORAL_TABLET | Freq: Every day | ORAL | Status: DC
  Filled 2023-02-05: qty 2

## 2023-02-05 MED ORDER — OXYCODONE HCL 5 MG PO TABS
5.0000 mg | ORAL_TABLET | ORAL | Status: DC | PRN
  Administered 2023-02-06: 5 mg via ORAL
  Filled 2023-02-05: qty 1

## 2023-02-05 MED ORDER — PANTOPRAZOLE SODIUM 40 MG PO TBEC
40.0000 mg | DELAYED_RELEASE_TABLET | Freq: Every day | ORAL | Status: DC
  Administered 2023-02-06: 40 mg via ORAL
  Filled 2023-02-05: qty 1

## 2023-02-05 MED ORDER — ROCURONIUM BROMIDE 10 MG/ML (PF) SYRINGE
PREFILLED_SYRINGE | INTRAVENOUS | Status: DC | PRN
  Administered 2023-02-05: 50 mg via INTRAVENOUS

## 2023-02-05 MED ORDER — EPHEDRINE SULFATE-NACL 50-0.9 MG/10ML-% IV SOSY
PREFILLED_SYRINGE | INTRAVENOUS | Status: DC | PRN
  Administered 2023-02-05: 10 mg via INTRAVENOUS

## 2023-02-05 MED ORDER — MENTHOL 3 MG MT LOZG: 1.0000 | LOZENGE | OROMUCOSAL | Status: DC | PRN

## 2023-02-05 MED ORDER — GLYCOPYRROLATE 0.2 MG/ML IJ SOLN
INTRAMUSCULAR | Status: DC | PRN
  Administered 2023-02-05: .2 mg via INTRAVENOUS

## 2023-02-05 MED ORDER — METHOCARBAMOL 500 MG PO TABS: 500.0000 mg | ORAL_TABLET | Freq: Four times a day (QID) | ORAL | Status: DC | PRN

## 2023-02-05 MED ORDER — HYDROMORPHONE HCL 1 MG/ML IJ SOLN: 0.5000 mg | INTRAMUSCULAR | Status: DC | PRN

## 2023-02-05 MED ORDER — LIDOCAINE 2% (20 MG/ML) 5 ML SYRINGE
INTRAMUSCULAR | Status: DC | PRN
  Administered 2023-02-05: 100 mg via INTRAVENOUS

## 2023-02-05 MED ORDER — ASPIRIN 81 MG PO TBEC
162.0000 mg | DELAYED_RELEASE_TABLET | Freq: Every day | ORAL | Status: DC
  Administered 2023-02-06: 162 mg via ORAL
  Filled 2023-02-05: qty 2

## 2023-02-05 MED ORDER — DIPHENHYDRAMINE HCL 12.5 MG/5ML PO ELIX: 12.5000 mg | ORAL_SOLUTION | ORAL | Status: DC | PRN

## 2023-02-05 MED ORDER — CEFAZOLIN SODIUM-DEXTROSE 2-4 GM/100ML-% IV SOLN
2.0000 g | INTRAVENOUS | Status: AC
  Administered 2023-02-05: 2 g via INTRAVENOUS
  Filled 2023-02-05: qty 100

## 2023-02-05 MED ORDER — BISACODYL 5 MG PO TBEC: 5.0000 mg | DELAYED_RELEASE_TABLET | Freq: Every day | ORAL | Status: DC | PRN

## 2023-02-05 MED ORDER — BUPIVACAINE HCL (PF) 0.5 % IJ SOLN
INTRAMUSCULAR | Status: DC | PRN
  Administered 2023-02-05: 13 mL via PERINEURAL

## 2023-02-05 MED ORDER — AMLODIPINE BESYLATE 5 MG PO TABS
5.0000 mg | ORAL_TABLET | Freq: Every day | ORAL | Status: DC
  Filled 2023-02-05: qty 1

## 2023-02-05 MED ORDER — FENTANYL CITRATE PF 50 MCG/ML IJ SOSY: 25.0000 ug | PREFILLED_SYRINGE | INTRAMUSCULAR | Status: DC | PRN

## 2023-02-05 MED ORDER — DEXAMETHASONE SODIUM PHOSPHATE 10 MG/ML IJ SOLN
INTRAMUSCULAR | Status: DC | PRN
  Administered 2023-02-05: 5 mg via INTRAVENOUS

## 2023-02-05 MED ORDER — ALUM & MAG HYDROXIDE-SIMETH 200-200-20 MG/5ML PO SUSP: 30.0000 mL | ORAL | Status: DC | PRN

## 2023-02-05 MED ORDER — LACTATED RINGERS IV SOLN: INTRAVENOUS | Status: DC

## 2023-02-05 MED ORDER — 0.9 % SODIUM CHLORIDE (POUR BTL) OPTIME
TOPICAL | Status: DC | PRN
  Administered 2023-02-05: 1000 mL

## 2023-02-05 MED ORDER — LEVOTHYROXINE SODIUM 50 MCG PO TABS
50.0000 ug | ORAL_TABLET | Freq: Every day | ORAL | Status: DC
  Administered 2023-02-06: 50 ug via ORAL
  Filled 2023-02-05: qty 1

## 2023-02-05 MED ORDER — ACETAMINOPHEN 10 MG/ML IV SOLN: 1000.0000 mg | Freq: Once | INTRAVENOUS | Status: DC | PRN

## 2023-02-05 MED ORDER — FENTANYL CITRATE (PF) 100 MCG/2ML IJ SOLN
INTRAMUSCULAR | Status: DC | PRN
  Administered 2023-02-05: 25 ug via INTRAVENOUS
  Administered 2023-02-05: 50 ug via INTRAVENOUS
  Administered 2023-02-05: 25 ug via INTRAVENOUS

## 2023-02-05 MED ORDER — ONDANSETRON HCL 4 MG/2ML IJ SOLN
INTRAMUSCULAR | Status: AC
  Filled 2023-02-05: qty 2

## 2023-02-05 MED ORDER — TRANEXAMIC ACID-NACL 1000-0.7 MG/100ML-% IV SOLN
1000.0000 mg | INTRAVENOUS | Status: AC
  Administered 2023-02-05: 1000 mg via INTRAVENOUS
  Filled 2023-02-05: qty 100

## 2023-02-05 MED ORDER — POLYETHYLENE GLYCOL 3350 17 G PO PACK: 17.0000 g | PACK | Freq: Every day | ORAL | Status: DC | PRN

## 2023-02-05 MED ORDER — GLYCOPYRROLATE 0.2 MG/ML IJ SOLN
INTRAMUSCULAR | Status: AC
  Filled 2023-02-05: qty 1

## 2023-02-05 MED ORDER — SODIUM CHLORIDE 0.9 % IV SOLN: INTRAVENOUS | Status: DC

## 2023-02-05 MED ORDER — PROPOFOL 10 MG/ML IV BOLUS
INTRAVENOUS | Status: DC | PRN
  Administered 2023-02-05: 100 mg via INTRAVENOUS

## 2023-02-05 MED ORDER — WATER FOR IRRIGATION, STERILE IR SOLN
Status: DC | PRN
  Administered 2023-02-05: 2000 mL

## 2023-02-05 MED ORDER — PHENOL 1.4 % MT LIQD: 1.0000 | OROMUCOSAL | Status: DC | PRN

## 2023-02-05 MED ORDER — ONDANSETRON HCL 4 MG/2ML IJ SOLN: 4.0000 mg | Freq: Once | INTRAMUSCULAR | Status: DC | PRN

## 2023-02-05 SURGICAL SUPPLY — 78 items
AID PSTN UNV HD RSTRNT DISP (MISCELLANEOUS) ×1
BAG COUNTER SPONGE SURGICOUNT (BAG) IMPLANT
BAG SPEC THK2 15X12 ZIP CLS (MISCELLANEOUS) ×1
BAG SPNG CNTER NS LX DISP (BAG)
BAG ZIPLOCK 12X15 (MISCELLANEOUS) ×2 IMPLANT
BASEPLATE P2 COATD GLND 6.5X30 (Shoulder) IMPLANT
BIT DRILL 1.6MX128 (BIT) IMPLANT
BIT DRILL 2.5 DIA 127 CALI (BIT) IMPLANT
BIT DRILL 4 DIA CALIBRATED (BIT) IMPLANT
BLADE SAW SGTL 73X25 THK (BLADE) ×2 IMPLANT
BOOTIES KNEE HIGH SLOAN (MISCELLANEOUS) ×4 IMPLANT
BSPLAT GLND 30 STRL LF SHLDR (Shoulder) ×1 IMPLANT
COOLER ICEMAN CLASSIC (MISCELLANEOUS) IMPLANT
COVER BACK TABLE 60X90IN (DRAPES) ×2 IMPLANT
COVER SURGICAL LIGHT HANDLE (MISCELLANEOUS) ×2 IMPLANT
DRAPE INCISE IOBAN 66X45 STRL (DRAPES) ×2 IMPLANT
DRAPE ORTHO SPLIT 77X108 STRL (DRAPES) ×2
DRAPE POUCH INSTRU U-SHP 10X18 (DRAPES) ×2 IMPLANT
DRAPE SHEET LG 3/4 BI-LAMINATE (DRAPES) ×2 IMPLANT
DRAPE SURG 17X11 SM STRL (DRAPES) ×2 IMPLANT
DRAPE SURG ORHT 6 SPLT 77X108 (DRAPES) ×4 IMPLANT
DRAPE TOP 10253 STERILE (DRAPES) ×2 IMPLANT
DRAPE U-SHAPE 47X51 STRL (DRAPES) ×2 IMPLANT
DRSG AQUACEL AG ADV 3.5X 4 (GAUZE/BANDAGES/DRESSINGS) IMPLANT
DRSG AQUACEL AG ADV 3.5X 6 (GAUZE/BANDAGES/DRESSINGS) ×2 IMPLANT
DURAPREP 26ML APPLICATOR (WOUND CARE) ×4 IMPLANT
ELECT BLADE TIP CTD 4 INCH (ELECTRODE) ×2 IMPLANT
ELECT REM PT RETURN 15FT ADLT (MISCELLANEOUS) ×2 IMPLANT
FACESHIELD WRAPAROUND (MASK) ×1 IMPLANT
FACESHIELD WRAPAROUND OR TEAM (MASK) ×2 IMPLANT
GLOVE BIO SURGEON STRL SZ7.5 (GLOVE) ×2 IMPLANT
GLOVE BIOGEL PI IND STRL 6.5 (GLOVE) ×2 IMPLANT
GLOVE BIOGEL PI IND STRL 8 (GLOVE) ×2 IMPLANT
GLOVE SURG SS PI 6.5 STRL IVOR (GLOVE) ×2 IMPLANT
GOWN STRL REUS W/ TWL LRG LVL3 (GOWN DISPOSABLE) ×2 IMPLANT
GOWN STRL REUS W/ TWL XL LVL3 (GOWN DISPOSABLE) ×2 IMPLANT
GOWN STRL REUS W/TWL LRG LVL3 (GOWN DISPOSABLE) ×1
GOWN STRL REUS W/TWL XL LVL3 (GOWN DISPOSABLE) ×1
HANDPIECE INTERPULSE COAX TIP (DISPOSABLE) ×1
HOOD PEEL AWAY T7 (MISCELLANEOUS) ×6 IMPLANT
INSERT SMALL SOCKET 32MM NEU (Insert) IMPLANT
KIT BASIN OR (CUSTOM PROCEDURE TRAY) ×2 IMPLANT
KIT TURNOVER KIT A (KITS) IMPLANT
MANIFOLD NEPTUNE II (INSTRUMENTS) ×2 IMPLANT
NDL TROCAR POINT SZ 2 1/2 (NEEDLE) IMPLANT
NEEDLE TROCAR POINT SZ 2 1/2 (NEEDLE) IMPLANT
NS IRRIG 1000ML POUR BTL (IV SOLUTION) ×2 IMPLANT
P2 COATDE GLNOID BSEPLT 6.5X30 (Shoulder) ×1 IMPLANT
PACK SHOULDER (CUSTOM PROCEDURE TRAY) ×2 IMPLANT
PAD COLD SHLDR WRAP-ON (PAD) IMPLANT
PROTECTOR NERVE ULNAR (MISCELLANEOUS) IMPLANT
RESTRAINT HEAD UNIVERSAL NS (MISCELLANEOUS) ×2 IMPLANT
RETRIEVER SUT HEWSON (MISCELLANEOUS) IMPLANT
SCREW BONE LOCKING RSP 5.0X14 (Screw) ×2 IMPLANT
SCREW BONE LOCKING RSP 5.0X30 (Screw) ×1 IMPLANT
SCREW BONE RSP LOCK 5X14 (Screw) IMPLANT
SCREW BONE RSP LOCK 5X22 (Screw) IMPLANT
SCREW BONE RSP LOCK 5X30 (Screw) IMPLANT
SCREW BONE RSP LOCKING 5.0X32 (Screw) ×1 IMPLANT
SCREW RETAIN W/HEAD 4MM OFFSET (Shoulder) IMPLANT
SET HNDPC FAN SPRY TIP SCT (DISPOSABLE) ×2 IMPLANT
SLING ARM IMMOBILIZER LRG (SOFTGOODS) IMPLANT
SLING ARM IMMOBILIZER MED (SOFTGOODS) IMPLANT
STEM HUMERAL 8X48 SHOULDER (Miscellaneous) IMPLANT
STRIP CLOSURE SKIN 1/2X4 (GAUZE/BANDAGES/DRESSINGS) ×4 IMPLANT
SUCTION TUBE FRAZIER 10FR DISP (SUCTIONS) IMPLANT
SUPPORT WRAP ARM LG (MISCELLANEOUS) ×2 IMPLANT
SUT ETHIBOND 2 V 37 (SUTURE) IMPLANT
SUT FIBERWIRE #2 38 REV NDL BL (SUTURE)
SUT MNCRL AB 4-0 PS2 18 (SUTURE) ×2 IMPLANT
SUT VIC AB 2-0 CT1 27 (SUTURE) ×2
SUT VIC AB 2-0 CT1 TAPERPNT 27 (SUTURE) ×4 IMPLANT
SUTURE FIBERWR#2 38 REV NDL BL (SUTURE) IMPLANT
TAPE LABRALWHITE 1.5X36 (TAPE) IMPLANT
TAPE SUT LABRALTAP WHT/BLK (SUTURE) IMPLANT
TOWEL OR 17X26 10 PK STRL BLUE (TOWEL DISPOSABLE) ×2 IMPLANT
TOWEL OR NON WOVEN STRL DISP B (DISPOSABLE) ×2 IMPLANT
WATER STERILE IRR 1000ML POUR (IV SOLUTION) ×2 IMPLANT

## 2023-02-05 NOTE — Discharge Instructions (Addendum)
Discharge Instructions after Reverse Total Shoulder Arthroplasty   A sling has been provided for you. You are to wear this at all times (except for bathing and dressing), until your first post operative visit with Dr. Ave Filter. Please also wear while sleeping at night. While you bath and dress, let the arm/elbow extend straight down to stretch your elbow. Wiggle your fingers and pump your first while your in the sling to prevent hand swelling. Use ice on the shoulder intermittently over the first 48 hours after surgery. Continue to use ice or and ice machine as needed after 48 hours for pain control/swelling.  Pain medicine has been prescribed for you.  Use your medicine liberally over the first 48 hours, and then you can begin to taper your use. You may take Extra Strength Tylenol or Tylenol only in place of the pain pills. DO NOT take ANY nonsteroidal anti-inflammatory pain medications: Advil, Motrin, Ibuprofen, Aleve, Naproxen or Naprosyn.  Resume aspirin the day after surgery  Leave your dressing on until your first follow up visit.  You may shower with the dressing.  Hold your arm as if you still have your sling on while you shower. Simply allow the water to wash over the site and then pat dry. Make sure your axilla (armpit) is completely dry after showering.    Please call [phone] during normal business hours or [phone] after hours for any problems. Including the following:  - excessive redness of the incisions - drainage for more than 4 days - fever of more than 101.5 F  *Please note that pain medications will not be refilled after hours or on weekends.    Dental Antibiotics:  In most cases prophylactic antibiotics for Dental procdeures after total joint surgery are not necessary.  Exceptions are as follows:  1. History of prior total joint infection  2. Severely immunocompromised (Organ Transplant, cancer chemotherapy, Rheumatoid biologic meds such as Humera)  3.  Poorly controlled diabetes (A1C &gt; 8.0, blood glucose over 200)  If you have one of these conditions, contact your surgeon for an antibiotic prescription, prior to your dental procedure.

## 2023-02-05 NOTE — Anesthesia Postprocedure Evaluation (Signed)
Anesthesia Post Note  Patient: Jasmine Smith  Procedure(s) Performed: REVERSE SHOULDER ARTHROPLASTY (Right: Shoulder)     Patient location during evaluation: PACU Anesthesia Type: General and Regional Level of consciousness: awake and alert Pain management: pain level controlled Vital Signs Assessment: post-procedure vital signs reviewed and stable Respiratory status: spontaneous breathing, nonlabored ventilation, respiratory function stable and patient connected to nasal cannula oxygen Cardiovascular status: blood pressure returned to baseline and stable Postop Assessment: no apparent nausea or vomiting Anesthetic complications: no   No notable events documented.  Last Vitals:  Vitals:   02/05/23 1000 02/05/23 1015  BP: (!) 135/49 (!) 131/50  Pulse: (!) 58 (!) 55  Resp: 12 14  Temp:  36.4 C  SpO2: 96% 97%    Last Pain:  Vitals:   02/05/23 1015  TempSrc:   PainSc: 0-No pain                 Trevor Iha

## 2023-02-05 NOTE — Transfer of Care (Signed)
Immediate Anesthesia Transfer of Care Note  Patient: Leila Schuff  Procedure(s) Performed: REVERSE SHOULDER ARTHROPLASTY (Right: Shoulder)  Patient Location: PACU  Anesthesia Type:General  Level of Consciousness: drowsy  Airway & Oxygen Therapy: Patient Spontanous Breathing and Patient connected to face mask oxygen  Post-op Assessment: Report given to RN and Post -op Vital signs reviewed and stable  Post vital signs: Reviewed and stable  Last Vitals:  Vitals Value Taken Time  BP    Temp    Pulse 67 02/05/23 0843  Resp    SpO2 100 % 02/05/23 0843  Vitals shown include unfiled device data.  Last Pain:  Vitals:   02/05/23 0556  TempSrc: Oral  PainSc:          Complications: No notable events documented.

## 2023-02-05 NOTE — Plan of Care (Signed)
  Problem: Elimination: Goal: Will not experience complications related to urinary retention Outcome: Progressing   Problem: Safety: Goal: Ability to remain free from injury will improve Outcome: Progressing   Problem: Education: Goal: Knowledge of the prescribed therapeutic regimen will improve Outcome: Progressing   Problem: Activity: Goal: Ability to tolerate increased activity will improve Outcome: Progressing   Problem: Pain Management: Goal: Pain level will decrease with appropriate interventions Outcome: Progressing

## 2023-02-05 NOTE — H&P (Signed)
Jasmine Smith is an 87 y.o. female.   Chief Complaint: R shoulder pain and dysfunction HPI: Failed R RCR with some arthritis.  Pain and dysfunction interfere with sleep and quality of life.  Failed conservative measures.  Past Medical History:  Diagnosis Date   Anxiety    Arthritis    CVA (cerebral infarction)    pt stated MRI 2014 showed mini strokes   Diverticulosis    Esophageal reflux    GERD (gastroesophageal reflux disease)    Hyperlipidemia    Hypertension    Hypothyroidism    PVC (premature ventricular contraction)    Tubular adenoma of colon 05/2015    Past Surgical History:  Procedure Laterality Date   ABDOMINAL HYSTERECTOMY  2002   with bladder tact   APPENDECTOMY     Bladder tack and sling  2002   BREAST CYST ASPIRATION Right    BREAST CYST EXCISION Left    x 2   BREAST EXCISIONAL BIOPSY Right 1996   benign   BREAST EXCISIONAL BIOPSY Right 1992   benign   BREAST EXCISIONAL BIOPSY Left 1987   benign   CATARACT EXTRACTION Bilateral    2005   CHOLECYSTECTOMY  1986   GANGLION CYST EXCISION Right    wrist   LUNG REMOVAL, PARTIAL     RML   Right rotator cuff surgery Right 2009   SHOULDER SURGERY Left 07/2014    Family History  Problem Relation Age of Onset   Diabetes Mother    Heart failure Father    Lung cancer Father    Colon polyps Sister    Heart attack Brother        Died of MI at age 44   Prostate cancer Brother        x 2   Diabetes Brother    Stomach cancer Paternal Grandmother    Stomach cancer Paternal Grandfather    Diabetes Maternal Aunt        x 5   Arthritis Daughter    Colon cancer Neg Hx    Social History:  reports that she has never smoked. She has never used smokeless tobacco. She reports that she does not drink alcohol and does not use drugs.  Allergies:  Allergies  Allergen Reactions   Augmentin [Amoxicillin-Pot Clavulanate] Other (See Comments)    Abdominal pain   Clarithromycin Nausea Only and Other (See Comments)    Levaquin  [Levofloxacin In D5w]    Levofloxacin     Poor Indigestion    Levofloxacin Other (See Comments) and Nausea Only    Poor Indigestion     Metronidazole Nausea And Vomiting   Tramadol Hcl Nausea And Vomiting    Medications Prior to Admission  Medication Sig Dispense Refill   amLODipine (NORVASC) 5 MG tablet Take 5 mg by mouth daily.      aspirin 81 MG tablet Take 162 mg by mouth daily.      Biotin 5000 MCG CAPS Take 5,000 mcg by mouth daily.     Carboxymethylcellulose Sodium (THERATEARS OP) Place 1 drop into both eyes in the morning, at noon, and at bedtime.     diclofenac Sodium (VOLTAREN ARTHRITIS PAIN) 1 % GEL Apply 2 g topically daily as needed (pain).     esomeprazole (NEXIUM) 20 MG capsule Take 20 mg by mouth daily at 12 noon.     gabapentin (NEURONTIN) 300 MG capsule Take 300 mg by mouth 3 (three) times daily.     HYDROcodone-acetaminophen (NORCO/VICODIN) 5-325 MG  tablet Take 1 tablet by mouth every 8 (eight) hours as needed for moderate pain.     KRILL OIL PO Take 1 capsule by mouth daily.       levothyroxine (SYNTHROID) 50 MCG tablet Take 50 mcg by mouth daily before breakfast.     LORazepam (ATIVAN) 0.5 MG tablet Take 0.5 mg by mouth 2 (two) times daily.     losartan (COZAAR) 100 MG tablet Take 100 mg by mouth daily.      Methylcellulose, Laxative, (CITRUCEL PO) Take 2 Scoops by mouth at bedtime.     metoprolol succinate (TOPROL-XL) 25 MG 24 hr tablet Take 12.5 mg by mouth daily.     Multiple Vitamin (MULTIVITAMIN) tablet Take 1 tablet by mouth daily.       polyethylene glycol (MIRALAX / GLYCOLAX) 17 g packet Take 17 g by mouth at bedtime.     rosuvastatin (CRESTOR) 5 MG tablet Take 5 mg by mouth daily.      No results found for this or any previous visit (from the past 48 hour(s)). No results found.  Review of Systems  All other systems reviewed and are negative.   Blood pressure (!) 146/50, pulse (!) 53, temperature 98.4 F (36.9 C), temperature source  Oral, resp. rate 12, height 5\' 2"  (1.575 m), weight 65.8 kg, SpO2 95%. Physical Exam HENT:     Head: Atraumatic.  Eyes:     Extraocular Movements: Extraocular movements intact.  Cardiovascular:     Pulses: Normal pulses.  Pulmonary:     Effort: Pulmonary effort is normal.  Musculoskeletal:     Comments: R shoulder pain with limited ROM> NVID  Neurological:     Mental Status: She is alert.      Assessment/Plan Failed R RCR with some arthritis.  Pain and dysfunction interfere with sleep and quality of life.  Failed conservative measures. Plan R reverse TSA Risks / benefits of surgery discussed Consent on chart  NPO for OR Preop antibiotics   Glennon Hamilton, MD 02/05/2023, 7:12 AM

## 2023-02-05 NOTE — Op Note (Addendum)
Procedure(s): REVERSE SHOULDER ARTHROPLASTY Procedure Note  Jasmine Smith female 87 y.o. 02/05/2023  Preoperative diagnosis:Right shoulder rotator cuff tear with dysfunction and arthropathy   Postoperative diagnosis: Same  Procedure(s) and Anesthesia Type:    * REVERSE SHOULDER ARTHROPLASTY - General   Indications:  87 y.o. female  s/p prior RCR with failure of repair, chronic pain and dysfunction. Pain and dysfunction interfered with quality of life and nonoperative treatment with activity modification, NSAIDS and injections failed.     Surgeon: Glennon Hamilton   Assistants: Fredia Sorrow PA-C [name] was present and scrubbed throughout the procedure and was essential in positioning, retraction, exposure, and closure)  Anesthesia: General endotracheal anesthesia with preoperative interscalene block given by the attending anesthesiologist    Procedure Detail  REVERSE SHOULDER ARTHROPLASTY   Estimated Blood Loss:  200 mL         Drains: none  Blood Given: none          Specimens: none        Complications:  * No complications entered in OR log *         Disposition: PACU - hemodynamically stable.         Condition: stable      OPERATIVE FINDINGS:  A DJO Altivate pressfit reverse total shoulder arthroplasty was placed with a  size 8 stem, a 32-4 glenosphere, and a standard-mm poly insert. The base plate  fixation was good.  PROCEDURE: The patient was identified in the preoperative holding area  where I personally marked the operative site after verifying site, side,  and procedure with the patient. An interscalene block given by  the attending anesthesiologist in the holding area and the patient was taken back to the operating room where all extremities were  carefully padded in position after general anesthesia was induced. She  was placed in a beach-chair position and the operative upper extremity was  prepped and draped in a standard sterile fashion.  An approximately 10-  cm incision was made from the tip of the coracoid process to the center  point of the humerus at the level of the axilla. Dissection was carried  down through subcutaneous tissues to the level of the cephalic vein  which was taken laterally with the deltoid. The pectoralis major was  retracted medially. The subdeltoid space was developed and the lateral  edge of the conjoined tendon was identified. The undersurface of  conjoined tendon was palpated and the musculocutaneous nerve was not in  the field. Retractor was placed underneath the conjoined and second  retractor was placed lateral into the deltoid. The circumflex humeral  artery and vessels were identified and clamped and coagulated. The  biceps tendon was tenotomized.  The subscapularis was chronically torn.  The  joint was then gently externally rotated while the capsule was released  from the humeral neck around to just beyond the 6 o'clock position. At  this point, the joint was dislocated and the humeral head was presented  into the wound. The excessive osteophyte formation was removed with a  large rongeur.  The cutting guide was used to make the appropriate  head cut and the head was saved for potentially bone grafting.  The glenoid was exposed with the arm in an  abducted extended position. The anterior and posterior labrum were  completely excised and the capsule was released circumferentially to  allow for exposure of the glenoid for preparation. The 2.5 mm drill was  placed using the guide in 5-10  inferior angulation and the tap was then advanced in the same hole. Small and large reamers were then used. The tap was then removed and the Metaglene was then screwed in with good purchase.  The peripheral guide was then used to drilled measured and filled peripheral locking screws. The size 32-4 glenosphere was then impacted on the [employer] taper and the central screw was placed. The humerus was then again exposed  and the diaphyseal reamers were used followed by the metaphyseal reamers. The final broach was left in place in the proximal trial was placed. The joint was reduced and with this implant it was felt that soft tissue tensioning was appropriate with excellent stability and excellent range of motion. Therefore, final humeral stem was placed press-fit.  And then the trial polyethylene inserts were tested again and the above implant was felt to be the most appropriate for final insertion. The joint was reduced taken through full range of motion and felt to be stable. Soft tissue tension was appropriate.  The joint was then copiously irrigated with pulse  lavage and the wound was then closed. The subscapularis was not repaired.  Skin was closed with 2-0 Vicryl in a deep dermal layer and 4-0  Monocryl for skin closure. Steri-Strips were applied. Sterile  dressings were then applied as well as a sling. The patient was allowed  to awaken from general anesthesia, transferred to stretcher, and taken  to recovery room in stable condition.   POSTOPERATIVE PLAN: The patient will be kept in the hospital postoperatively overnight for pain control and observation.

## 2023-02-05 NOTE — Anesthesia Procedure Notes (Signed)
Procedure Name: Intubation Date/Time: 02/05/2023 7:30 AM  Performed by: Orest Dikes, CRNAPre-anesthesia Checklist: Patient identified, Emergency Drugs available, Suction available and Patient being monitored Patient Re-evaluated:Patient Re-evaluated prior to induction Oxygen Delivery Method: Circle system utilized Preoxygenation: Pre-oxygenation with 100% oxygen Induction Type: IV induction Ventilation: Mask ventilation without difficulty Laryngoscope Size: Mac and 4 Grade View: Grade I Tube type: Oral Tube size: 7.0 mm Number of attempts: 1 Airway Equipment and Method: Stylet Placement Confirmation: ETT inserted through vocal cords under direct vision, positive ETCO2 and breath sounds checked- equal and bilateral Secured at: 21 cm Tube secured with: Tape Dental Injury: Teeth and Oropharynx as per pre-operative assessment

## 2023-02-05 NOTE — Anesthesia Procedure Notes (Signed)
Anesthesia Regional Block: Interscalene brachial plexus block   Pre-Anesthetic Checklist: , timeout performed,  Correct Patient, Correct Site, Correct Laterality,  Correct Procedure, Correct Position, site marked,  Risks and benefits discussed,  Surgical consent,  Pre-op evaluation,  At surgeon's request and post-op pain management  Laterality: Upper and Right  Prep: Maximum Sterile Barrier Precautions used, chloraprep       Needles:  Injection technique: Single-shot  Needle Type: Echogenic Needle     Needle Length: 5cm  Needle Gauge: 21     Additional Needles:   Procedures:,,,, ultrasound used (permanent image in chart),,    Narrative:  Start time: 02/05/2023 6:48 AM End time: 02/05/2023 6:54 AM Injection made incrementally with aspirations every 5 mL.  Performed by: Personally  Anesthesiologist: Trevor Iha, MD  Additional Notes: Block assessed prior to procedure. Patient tolerated procedure well.

## 2023-02-06 DIAGNOSIS — Z8673 Personal history of transient ischemic attack (TIA), and cerebral infarction without residual deficits: Secondary | ICD-10-CM | POA: Diagnosis not present

## 2023-02-06 DIAGNOSIS — I10 Essential (primary) hypertension: Secondary | ICD-10-CM | POA: Diagnosis not present

## 2023-02-06 DIAGNOSIS — M25811 Other specified joint disorders, right shoulder: Secondary | ICD-10-CM | POA: Diagnosis not present

## 2023-02-06 DIAGNOSIS — Z79899 Other long term (current) drug therapy: Secondary | ICD-10-CM | POA: Diagnosis not present

## 2023-02-06 DIAGNOSIS — E039 Hypothyroidism, unspecified: Secondary | ICD-10-CM | POA: Diagnosis not present

## 2023-02-06 DIAGNOSIS — Z7982 Long term (current) use of aspirin: Secondary | ICD-10-CM | POA: Diagnosis not present

## 2023-02-06 MED ORDER — METHOCARBAMOL 500 MG PO TABS: 500.0000 mg | ORAL_TABLET | Freq: Four times a day (QID) | ORAL | 0 refills | Status: AC | PRN

## 2023-02-06 MED ORDER — OXYCODONE HCL 5 MG PO TABS: 5.0000 mg | ORAL_TABLET | ORAL | 0 refills | Status: DC | PRN

## 2023-02-06 NOTE — Evaluation (Signed)
Occupational Therapy Evaluation/Discharge Patient Details Name: Jasmine Smith MRN: [id number] DOB: 01/12/1936 Today's Date: 02/06/2023   History of Present Illness 87 y.o. female s/p right total reverse total shoulder arthroplasty.   Clinical Impression   Patient evaluated by Occupational Therapy with no further acute OT needs identified. All education has been completed and the patient has no further questions. Prior to surgery, pt was living with her husband at home, independent with all BADL tasks and functional mobility. Daughter present in room during session. Provided all post-op shoulder education and all questions answered. Pt will follow up with Dr. Ave Filter in 2 weeks and he will recommend follow up therapy services when needed.  OT is signing off. Thank you for this referral.       Recommendations for follow up therapy are one component of a multi-disciplinary discharge planning process, led by the attending physician.  Recommendations may be updated based on patient status, additional functional criteria and insurance authorization.   Assistance Recommended at Discharge Intermittent Supervision/Assistance  Patient can return home with the following A little help with bathing/dressing/bathroom;Assistance with cooking/housework;Assist for transportation    Functional Status Assessment  Patient has had a recent decline in their functional status and demonstrates the ability to make significant improvements in function in a reasonable and predictable amount of time.  Equipment Recommendations  None recommended by OT       Precautions / Restrictions Precautions Precautions: Shoulder Type of Shoulder Precautions: post op reverse total shoulder arthroplasty. sling 24/7. WB restrictions. OK for A/ROM elbow, wrist, and hand. Shoulder Interventions: Shoulder sling/immobilizer;At all times;Off for dressing/bathing/exercises Precaution Booklet Issued: Yes (comment) Precaution  Comments: handout provided for reference. Verbal education provided to patient and daughter regarding WB status, ROM restrictions, shoulder restrictions, compensatory techniques when completing ADL tasks. Required Braces or Orthoses: Sling Restrictions Weight Bearing Restrictions: Yes RUE Weight Bearing: Non weight bearing      Mobility Bed Mobility Overal bed mobility: Needs Assistance Bed Mobility: Supine to Sit     Supine to sit: Min assist, HOB elevated     General bed mobility comments: sitting on left side of bed. Pt required assist to bring trunk off HOB to sit on EOB. Patient Response: Cooperative  Transfers Overall transfer level: Needs assistance Equipment used: None Transfers: Sit to/from Stand Sit to Stand: Supervision, From elevated surface         Balance Overall balance assessment: No apparent balance deficits (not formally assessed)        ADL either performed or assessed with clinical judgement   ADL         General ADL Comments: Due to recent RUE shoulder surgery pt requires Min A to complete dressing tasks. Will require min-mod A for bathing. family is able to assist at home.     Vision Baseline Vision/History: 1 Wears glasses Ability to See in Adequate Light: 0 Adequate Patient Visual Report: No change from baseline              Pertinent Vitals/Pain Pain Assessment Pain Assessment: No/denies pain     Hand Dominance Right   Extremity/Trunk Assessment Upper Extremity Assessment Upper Extremity Assessment: RUE deficits/detail RUE Deficits / Details: recent shoulder surgery. Did not assess ROM of shoulder. Nerve block not 100% worn off at eval. Able to demonstration functional A/ROM of hand and wrist. RUE: Unable to fully assess due to immobilization   Lower Extremity Assessment Lower Extremity Assessment: Overall WFL for tasks assessed   Cervical / Trunk Assessment Cervical /  Trunk Assessment: Normal   Communication  Communication Communication: No difficulties   Cognition Arousal/Alertness: Awake/alert Behavior During Therapy: WFL for tasks assessed/performed Overall Cognitive Status: Within Functional Limits for tasks assessed      General Comments  surgical dressing intact on right shoulder. Post op bruising present on medial region of upper arm.       Shoulder Instructions Shoulder Instructions Donning/doffing shirt without moving shoulder: Minimal assistance;Caregiver independent with task;Patient able to independently direct caregiver Method for sponge bathing under operated UE: Minimal assistance;Caregiver independent with task;Patient able to independently direct caregiver Donning/doffing sling/immobilizer: Minimal assistance;Caregiver independent with task;Patient able to independently direct caregiver Correct positioning of sling/immobilizer: Minimal assistance;Caregiver independent with task;Patient able to independently direct caregiver ROM for elbow, wrist and digits of operated UE: Caregiver independent with task;Patient able to independently direct caregiver;Independent Sling wearing schedule (on at all times/off for ADL's): Independent;Caregiver independent with task;Patient able to independently direct caregiver Proper positioning of operated UE when showering: Minimal assistance;Caregiver independent with task;Patient able to independently direct caregiver Positioning of UE while sleeping: Minimal assistance;Caregiver independent with task;Patient able to independently direct caregiver    Home Living Family/patient expects to be discharged to:: Private residence Living Arrangements: Spouse/significant other Available Help at Discharge: Family;Available 24 hours/day (Two daughters will be available to assist pt as needed.)        Prior Functioning/Environment Prior Level of Function : Independent/Modified Independent    Mobility Comments: independent ADLs Comments: independent         OT Problem List: Decreased strength;Decreased knowledge of precautions;Pain;Increased edema;Decreased range of motion;Impaired UE functional use      OT Treatment/Interventions:   Self care/home management   OT Goals(Current goals can be found in the care plan section) Acute Rehab OT Goals Patient Stated Goal: to go home  OT Frequency:  1X visit       AM-PAC OT "6 Clicks" Daily Activity     Outcome Measure Help from another person eating meals?: A Little Help from another person taking care of personal grooming?: A Little Help from another person toileting, which includes using toliet, bedpan, or urinal?: A Little Help from another person bathing (including washing, rinsing, drying)?: A Little Help from another person to put on and taking off regular upper body clothing?: A Little Help from another person to put on and taking off regular lower body clothing?: A Little 6 Click Score: 18   End of Session Equipment Utilized During Treatment: Other (comment) (UE sling) Nurse Communication: Other (comment) (Pt ready for d/c from therapy aspect)  Activity Tolerance: Patient tolerated treatment well Patient left: in bed;with call bell/phone within reach;with family/visitor present;with nursing/sitter in room  OT Visit Diagnosis: Muscle weakness (generalized) (M62.81);Pain Pain - Right/Left: Right Pain - part of body: Shoulder                Time: 4098-1191 OT Time Calculation (min): 22 min Charges:  OT General Charges $OT Visit: 1 Visit OT Evaluation $OT Eval High Complexity: 1 High OT Treatments $Self Care/Home Management : 8-22 mins Limmie Patricia, OTR/L,CBIS  Supplemental OT - [facility] and [facility] Secure Chat Preferred    Gilford Lardizabal, Charisse March 02/06/2023, 2:08 PM

## 2023-02-06 NOTE — Progress Notes (Signed)
PATIENT ID: Jasmine Smith  MRN: [id number]  DOB/AGE:  [birth year] / 87 y.o.  1 Day Post-Op Procedure(s) (LRB): REVERSE SHOULDER ARTHROPLASTY (Right)  Subjective: Patient reports that she has minimal pain in the right shoulder and arm. Still has some "numbness and tingling in the forearm". Denies shortness of breath or chest pain.   Objective: Vital signs in last 24 hours: Temp:  [97 F (36.1 C)-98.6 F (37 C)] 98.3 F (36.8 C) (07/12 0630) Pulse Rate:  [55-67] 66 (07/12 0630) Resp:  [12-18] 18 (07/12 0630) BP: (127-152)/(46-77) 152/61 (07/12 0630) SpO2:  [94 %-100 %] 95 % (07/12 0630)  Intake/Output from previous day: 07/11 0701 - 07/12 0700 In: 2709 [P.O.:360; I.V.:2149; IV Piggyback:200] Out: 20 [Blood:20]   Physical Exam: Neurologically intact Sensation to light touch still diminished in RUE due to block Able to move fingers and wrist of RUE Intact pulses distally Incision: dressing C/D/I No cellulitis present   Assessment/Plan: 1 Day Post-Op Procedure(s) (LRB): REVERSE SHOULDER ARTHROPLASTY (Right)   Advance diet Up with therapy Discharge home  Non Weight Bearing (NWB) RUE VTE prophylaxis:  aspirin Patient to work with OT this morning. DC home today. DC instructions given. Follow up in office in 2 weeks.    Severiano Utsey L. Porterfield, PA-C 02/06/2023, 8:12 AM

## 2023-02-06 NOTE — Progress Notes (Signed)
   02/06/23 1042  TOC Brief Assessment  Insurance and Status Reviewed  Patient has primary care physician Yes  Home environment has been reviewed Resides with spouse  Prior level of function: Independent  Prior/Current Home Services No current home services  Social Determinants of Health Reivew SDOH reviewed no interventions necessary  Readmission risk has been reviewed Yes  Transition of care needs no transition of care needs at this time

## 2023-02-06 NOTE — Care Management Obs Status (Signed)
MEDICARE OBSERVATION STATUS NOTIFICATION   Patient Details  Name: Jasmine Smith MRN: [id number] Date of Birth: 1935/08/29   Medicare Observation Status Notification Given:  Yes    Ewing Schlein, LCSW 02/06/2023, 10:31 AM

## 2023-02-11 ENCOUNTER — Encounter (HOSPITAL_COMMUNITY): Payer: Self-pay | Admitting: Orthopedic Surgery

## 2023-02-12 NOTE — Discharge Summary (Signed)
Patient ID: Jasmine Smith MRN: [id number] DOB/AGE: January 01, 1936 87 y.o.  Admit date: 02/05/2023 Discharge date: 02/06/2023  Admission Diagnoses:  Principal Problem:   S/P reverse total shoulder arthroplasty, right   Discharge Diagnoses:  Same  Past Medical History:  Diagnosis Date   Anxiety    Arthritis    CVA (cerebral infarction)    pt stated MRI 2014 showed mini strokes   Diverticulosis    Esophageal reflux    GERD (gastroesophageal reflux disease)    Hyperlipidemia    Hypertension    Hypothyroidism    PVC (premature ventricular contraction)    Tubular adenoma of colon 05/2015    Surgeries: Procedure(s): REVERSE SHOULDER ARTHROPLASTY on 02/05/2023   Consultants:   Discharged Condition: Improved  Hospital Course: Jnyah Brazee is an 87 y.o. female who was admitted 02/05/2023 for operative treatment ofS/P reverse total shoulder arthroplasty, right. Patient has severe unremitting pain that affects sleep, daily activities, and work/hobbies. After pre-op clearance the patient was taken to the operating room on 02/05/2023 and underwent  Procedure(s): REVERSE SHOULDER ARTHROPLASTY.    Patient was given perioperative antibiotics:  Anti-infectives (From admission, onward)    Start     Dose/Rate Route Frequency Ordered Stop   02/05/23 0600  ceFAZolin (ANCEF) IVPB 2g/100 mL premix        2 g 200 mL/hr over 30 Minutes Intravenous On call to O.R. 02/05/23 4403 02/05/23 0732        Patient was given sequential compression devices, early ambulation, and chemoprophylaxis to prevent DVT.  Patient benefited maximally from hospital stay and there were no complications.       Discharge Medications:   Allergies as of 02/06/2023       Reactions   Augmentin [amoxicillin-pot Clavulanate] Other (See Comments)   Abdominal pain   Clarithromycin Nausea Only, Other (See Comments)   Levaquin  [levofloxacin In D5w]    Levofloxacin    Poor Indigestion    Levofloxacin Other (See  Comments), Nausea Only   Poor Indigestion    Metronidazole Nausea And Vomiting   Tramadol Hcl Nausea And Vomiting        Medication List     STOP taking these medications    HYDROcodone-acetaminophen 5-325 MG tablet Commonly known as: NORCO/VICODIN       TAKE these medications    amLODipine 5 MG tablet Commonly known as: NORVASC Take 5 mg by mouth daily.   aspirin 81 MG tablet Take 162 mg by mouth daily.   Biotin 5000 MCG Caps Take 5,000 mcg by mouth daily.   CITRUCEL PO Take 2 Scoops by mouth at bedtime.   esomeprazole 20 MG capsule Commonly known as: NEXIUM Take 20 mg by mouth daily at 12 noon.   gabapentin 300 MG capsule Commonly known as: NEURONTIN Take 300 mg by mouth 3 (three) times daily.   KRILL OIL PO Take 1 capsule by mouth daily.   levothyroxine 50 MCG tablet Commonly known as: SYNTHROID Take 50 mcg by mouth daily before breakfast.   LORazepam 0.5 MG tablet Commonly known as: ATIVAN Take 0.5 mg by mouth 2 (two) times daily.   losartan 100 MG tablet Commonly known as: COZAAR Take 100 mg by mouth daily.   methocarbamol 500 MG tablet Commonly known as: ROBAXIN Take 1 tablet (500 mg total) by mouth every 6 (six) hours as needed for muscle spasms.   metoprolol succinate 25 MG 24 hr tablet Commonly known as: TOPROL-XL Take 12.5 mg by mouth daily.  multivitamin tablet Take 1 tablet by mouth daily.   oxyCODONE 5 MG immediate release tablet Commonly known as: Oxy IR/ROXICODONE Take 1 tablet (5 mg total) by mouth every 4 (four) hours as needed for moderate pain (pain score 4-6).   polyethylene glycol 17 g packet Commonly known as: MIRALAX / GLYCOLAX Take 17 g by mouth at bedtime.   rosuvastatin 5 MG tablet Commonly known as: CRESTOR Take 5 mg by mouth daily.   THERATEARS OP Place 1 drop into both eyes in the morning, at noon, and at bedtime.   Voltaren Arthritis Pain 1 % Gel Generic drug: diclofenac Sodium Apply 2 g topically  daily as needed (pain).        Diagnostic Studies: DG Chest 2 View  Result Date: 01/30/2023 CLINICAL DATA:  Preop chest exam. EXAM: CHEST - 2 VIEW COMPARISON:  01/05/2018 FINDINGS: The cardiomediastinal contours are normal. Minor scarring in the right middle lobe. Pulmonary vasculature is normal. No consolidation, pleural effusion, or pneumothorax. No acute osseous abnormalities are seen. IMPRESSION: No acute chest findings. Minor right middle lobe scarring. Electronically Signed   By: Narda Rutherford M.D.   On: 01/30/2023 23:59    Disposition: Discharge disposition: 01-Home or Self Care          Follow-up Information     Porterfield, Glendora Clouatre, PA-C Follow up on 02/20/2023.   Specialty: Orthopedic Surgery Contact information: [address] [place] [address] [phone]                  Signed: Joice Lofts L. Porterfield, PA-C 02/12/2023, 4:18 AM

## 2023-02-20 DIAGNOSIS — M75121 Complete rotator cuff tear or rupture of right shoulder, not specified as traumatic: Secondary | ICD-10-CM | POA: Diagnosis not present

## 2023-02-25 DIAGNOSIS — M75121 Complete rotator cuff tear or rupture of right shoulder, not specified as traumatic: Secondary | ICD-10-CM | POA: Diagnosis not present

## 2023-02-25 DIAGNOSIS — M19011 Primary osteoarthritis, right shoulder: Secondary | ICD-10-CM | POA: Diagnosis not present

## 2023-03-02 DIAGNOSIS — M19011 Primary osteoarthritis, right shoulder: Secondary | ICD-10-CM | POA: Diagnosis not present

## 2023-03-02 DIAGNOSIS — M75121 Complete rotator cuff tear or rupture of right shoulder, not specified as traumatic: Secondary | ICD-10-CM | POA: Diagnosis not present

## 2023-03-04 DIAGNOSIS — M75121 Complete rotator cuff tear or rupture of right shoulder, not specified as traumatic: Secondary | ICD-10-CM | POA: Diagnosis not present

## 2023-03-04 DIAGNOSIS — M19011 Primary osteoarthritis, right shoulder: Secondary | ICD-10-CM | POA: Diagnosis not present

## 2023-03-09 DIAGNOSIS — M75121 Complete rotator cuff tear or rupture of right shoulder, not specified as traumatic: Secondary | ICD-10-CM | POA: Diagnosis not present

## 2023-03-09 DIAGNOSIS — M19011 Primary osteoarthritis, right shoulder: Secondary | ICD-10-CM | POA: Diagnosis not present

## 2023-03-12 DIAGNOSIS — H401211 Low-tension glaucoma, right eye, mild stage: Secondary | ICD-10-CM | POA: Diagnosis not present

## 2023-03-12 DIAGNOSIS — M75121 Complete rotator cuff tear or rupture of right shoulder, not specified as traumatic: Secondary | ICD-10-CM | POA: Diagnosis not present

## 2023-03-12 DIAGNOSIS — Z961 Presence of intraocular lens: Secondary | ICD-10-CM | POA: Diagnosis not present

## 2023-03-12 DIAGNOSIS — H401222 Low-tension glaucoma, left eye, moderate stage: Secondary | ICD-10-CM | POA: Diagnosis not present

## 2023-03-12 DIAGNOSIS — M19011 Primary osteoarthritis, right shoulder: Secondary | ICD-10-CM | POA: Diagnosis not present

## 2023-03-12 DIAGNOSIS — H04123 Dry eye syndrome of bilateral lacrimal glands: Secondary | ICD-10-CM | POA: Diagnosis not present

## 2023-03-16 ENCOUNTER — Other Ambulatory Visit: Payer: Self-pay | Admitting: Family Medicine

## 2023-03-16 DIAGNOSIS — Z1231 Encounter for screening mammogram for malignant neoplasm of breast: Secondary | ICD-10-CM

## 2023-03-16 DIAGNOSIS — M75121 Complete rotator cuff tear or rupture of right shoulder, not specified as traumatic: Secondary | ICD-10-CM | POA: Diagnosis not present

## 2023-03-16 DIAGNOSIS — M19011 Primary osteoarthritis, right shoulder: Secondary | ICD-10-CM | POA: Diagnosis not present

## 2023-03-18 DIAGNOSIS — M19011 Primary osteoarthritis, right shoulder: Secondary | ICD-10-CM | POA: Diagnosis not present

## 2023-03-18 DIAGNOSIS — M75121 Complete rotator cuff tear or rupture of right shoulder, not specified as traumatic: Secondary | ICD-10-CM | POA: Diagnosis not present

## 2023-03-23 DIAGNOSIS — R7303 Prediabetes: Secondary | ICD-10-CM | POA: Diagnosis not present

## 2023-03-23 DIAGNOSIS — E7800 Pure hypercholesterolemia, unspecified: Secondary | ICD-10-CM | POA: Diagnosis not present

## 2023-03-23 DIAGNOSIS — I10 Essential (primary) hypertension: Secondary | ICD-10-CM | POA: Diagnosis not present

## 2023-03-23 DIAGNOSIS — E039 Hypothyroidism, unspecified: Secondary | ICD-10-CM | POA: Diagnosis not present

## 2023-03-25 DIAGNOSIS — M75121 Complete rotator cuff tear or rupture of right shoulder, not specified as traumatic: Secondary | ICD-10-CM | POA: Diagnosis not present

## 2023-03-25 DIAGNOSIS — M19011 Primary osteoarthritis, right shoulder: Secondary | ICD-10-CM | POA: Diagnosis not present

## 2023-03-27 ENCOUNTER — Ambulatory Visit
Admission: RE | Admit: 2023-03-27 | Discharge: 2023-03-27 | Disposition: A | Discharge status: Home / Self Care | Payer: Medicare Other | Source: Ambulatory Visit | Attending: Family Medicine | Admitting: Family Medicine

## 2023-03-27 DIAGNOSIS — M75121 Complete rotator cuff tear or rupture of right shoulder, not specified as traumatic: Secondary | ICD-10-CM | POA: Diagnosis not present

## 2023-03-27 DIAGNOSIS — N958 Other specified menopausal and perimenopausal disorders: Secondary | ICD-10-CM | POA: Diagnosis not present

## 2023-03-27 DIAGNOSIS — M8588 Other specified disorders of bone density and structure, other site: Secondary | ICD-10-CM | POA: Diagnosis not present

## 2023-03-27 DIAGNOSIS — E2839 Other primary ovarian failure: Secondary | ICD-10-CM | POA: Diagnosis not present

## 2023-03-27 DIAGNOSIS — M85852 Other specified disorders of bone density and structure, left thigh: Secondary | ICD-10-CM

## 2023-03-27 DIAGNOSIS — M19011 Primary osteoarthritis, right shoulder: Secondary | ICD-10-CM | POA: Diagnosis not present

## 2023-03-31 DIAGNOSIS — M19011 Primary osteoarthritis, right shoulder: Secondary | ICD-10-CM | POA: Diagnosis not present

## 2023-03-31 DIAGNOSIS — M75121 Complete rotator cuff tear or rupture of right shoulder, not specified as traumatic: Secondary | ICD-10-CM | POA: Diagnosis not present

## 2023-04-02 DIAGNOSIS — M75121 Complete rotator cuff tear or rupture of right shoulder, not specified as traumatic: Secondary | ICD-10-CM | POA: Diagnosis not present

## 2023-04-02 DIAGNOSIS — M19011 Primary osteoarthritis, right shoulder: Secondary | ICD-10-CM | POA: Diagnosis not present

## 2023-04-07 DIAGNOSIS — M75121 Complete rotator cuff tear or rupture of right shoulder, not specified as traumatic: Secondary | ICD-10-CM | POA: Diagnosis not present

## 2023-04-07 DIAGNOSIS — M19011 Primary osteoarthritis, right shoulder: Secondary | ICD-10-CM | POA: Diagnosis not present

## 2023-04-09 DIAGNOSIS — M19011 Primary osteoarthritis, right shoulder: Secondary | ICD-10-CM | POA: Diagnosis not present

## 2023-04-09 DIAGNOSIS — M75121 Complete rotator cuff tear or rupture of right shoulder, not specified as traumatic: Secondary | ICD-10-CM | POA: Diagnosis not present

## 2023-04-14 DIAGNOSIS — M19011 Primary osteoarthritis, right shoulder: Secondary | ICD-10-CM | POA: Diagnosis not present

## 2023-04-14 DIAGNOSIS — M75121 Complete rotator cuff tear or rupture of right shoulder, not specified as traumatic: Secondary | ICD-10-CM | POA: Diagnosis not present

## 2023-04-21 DIAGNOSIS — M75121 Complete rotator cuff tear or rupture of right shoulder, not specified as traumatic: Secondary | ICD-10-CM | POA: Diagnosis not present

## 2023-04-21 DIAGNOSIS — M19011 Primary osteoarthritis, right shoulder: Secondary | ICD-10-CM | POA: Diagnosis not present

## 2023-04-23 DIAGNOSIS — M75121 Complete rotator cuff tear or rupture of right shoulder, not specified as traumatic: Secondary | ICD-10-CM | POA: Diagnosis not present

## 2023-04-23 DIAGNOSIS — M19011 Primary osteoarthritis, right shoulder: Secondary | ICD-10-CM | POA: Diagnosis not present

## 2023-04-27 DIAGNOSIS — N390 Urinary tract infection, site not specified: Secondary | ICD-10-CM | POA: Diagnosis not present

## 2023-04-28 ENCOUNTER — Ambulatory Visit
Admission: RE | Admit: 2023-04-28 | Discharge: 2023-04-28 | Disposition: A | Discharge status: Home / Self Care | Payer: Medicare Other | Source: Ambulatory Visit | Attending: Family Medicine | Admitting: Family Medicine

## 2023-04-28 DIAGNOSIS — Z1231 Encounter for screening mammogram for malignant neoplasm of breast: Secondary | ICD-10-CM

## 2023-04-28 DIAGNOSIS — M19011 Primary osteoarthritis, right shoulder: Secondary | ICD-10-CM | POA: Diagnosis not present

## 2023-04-28 DIAGNOSIS — M75121 Complete rotator cuff tear or rupture of right shoulder, not specified as traumatic: Secondary | ICD-10-CM | POA: Diagnosis not present

## 2023-04-30 DIAGNOSIS — M19011 Primary osteoarthritis, right shoulder: Secondary | ICD-10-CM | POA: Diagnosis not present

## 2023-04-30 DIAGNOSIS — M75121 Complete rotator cuff tear or rupture of right shoulder, not specified as traumatic: Secondary | ICD-10-CM | POA: Diagnosis not present

## 2023-05-05 DIAGNOSIS — M75121 Complete rotator cuff tear or rupture of right shoulder, not specified as traumatic: Secondary | ICD-10-CM | POA: Diagnosis not present

## 2023-05-05 DIAGNOSIS — M19011 Primary osteoarthritis, right shoulder: Secondary | ICD-10-CM | POA: Diagnosis not present

## 2023-05-06 DIAGNOSIS — M19011 Primary osteoarthritis, right shoulder: Secondary | ICD-10-CM | POA: Diagnosis not present

## 2023-05-07 DIAGNOSIS — M75121 Complete rotator cuff tear or rupture of right shoulder, not specified as traumatic: Secondary | ICD-10-CM | POA: Diagnosis not present

## 2023-05-07 DIAGNOSIS — M19011 Primary osteoarthritis, right shoulder: Secondary | ICD-10-CM | POA: Diagnosis not present

## 2023-05-12 DIAGNOSIS — M75121 Complete rotator cuff tear or rupture of right shoulder, not specified as traumatic: Secondary | ICD-10-CM | POA: Diagnosis not present

## 2023-05-12 DIAGNOSIS — Z23 Encounter for immunization: Secondary | ICD-10-CM | POA: Diagnosis not present

## 2023-05-12 DIAGNOSIS — M19011 Primary osteoarthritis, right shoulder: Secondary | ICD-10-CM | POA: Diagnosis not present

## 2023-05-13 DIAGNOSIS — M25511 Pain in right shoulder: Secondary | ICD-10-CM | POA: Diagnosis not present

## 2023-05-16 DIAGNOSIS — N390 Urinary tract infection, site not specified: Secondary | ICD-10-CM | POA: Diagnosis not present

## 2023-05-20 DIAGNOSIS — M25511 Pain in right shoulder: Secondary | ICD-10-CM | POA: Diagnosis not present

## 2023-05-21 ENCOUNTER — Other Ambulatory Visit: Payer: Self-pay | Admitting: Orthopedic Surgery

## 2023-05-21 DIAGNOSIS — G8929 Other chronic pain: Secondary | ICD-10-CM

## 2023-05-25 ENCOUNTER — Ambulatory Visit
Admission: RE | Admit: 2023-05-25 | Discharge: 2023-05-25 | Disposition: A | Discharge status: Home / Self Care | Payer: Medicare Other | Source: Ambulatory Visit | Attending: Orthopedic Surgery | Admitting: Orthopedic Surgery

## 2023-05-25 DIAGNOSIS — G8929 Other chronic pain: Secondary | ICD-10-CM

## 2023-05-25 DIAGNOSIS — M25511 Pain in right shoulder: Secondary | ICD-10-CM | POA: Diagnosis not present

## 2023-06-01 DIAGNOSIS — M25511 Pain in right shoulder: Secondary | ICD-10-CM | POA: Diagnosis not present

## 2023-06-02 ENCOUNTER — Other Ambulatory Visit: Payer: Medicare Other

## 2023-06-28 DIAGNOSIS — R399 Unspecified symptoms and signs involving the genitourinary system: Secondary | ICD-10-CM | POA: Diagnosis not present

## 2023-06-28 DIAGNOSIS — N3000 Acute cystitis without hematuria: Secondary | ICD-10-CM | POA: Diagnosis not present

## 2023-07-15 DIAGNOSIS — Z961 Presence of intraocular lens: Secondary | ICD-10-CM | POA: Diagnosis not present

## 2023-07-15 DIAGNOSIS — H04123 Dry eye syndrome of bilateral lacrimal glands: Secondary | ICD-10-CM | POA: Diagnosis not present

## 2023-07-15 DIAGNOSIS — H401211 Low-tension glaucoma, right eye, mild stage: Secondary | ICD-10-CM | POA: Diagnosis not present

## 2023-07-15 DIAGNOSIS — K080 Exfoliation of teeth due to systemic causes: Secondary | ICD-10-CM | POA: Diagnosis not present

## 2023-07-15 DIAGNOSIS — H401222 Low-tension glaucoma, left eye, moderate stage: Secondary | ICD-10-CM | POA: Diagnosis not present

## 2023-07-15 IMAGING — MR MR LUMBAR SPINE W/O CM
4 of 5 series · 18 of 48 positions shown · non-contrast
Comparison: Lumbar spine MRI 02/18/2018. CT abdomen/pelvis
01/11/2020.

CLINICAL DATA: Thoracic spondylosis without myelopathy. Chronic low
back pain, unspecified back pain laterality, unspecified whether
sciatica present. Additional history provided by scanning
technologist: Patient reports back pain for years, worsening.

EXAM:
MRI LUMBAR SPINE WITHOUT CONTRAST
TECHNIQUE: Multiplanar, multisequence MR imaging of the lumbar spine was
performed. No intravenous contrast was administered.

[Series 27: T1 · sagittal · 4.0mm · 0.73mm/px · 3 of 15 slices shown (1 of 2)]
[im 3/15]
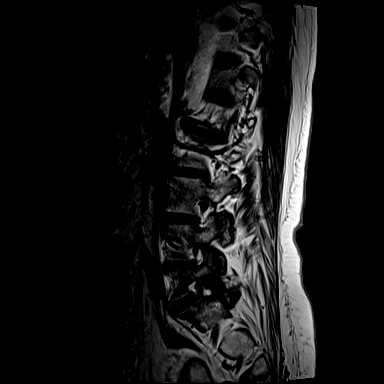
[im 9/15]
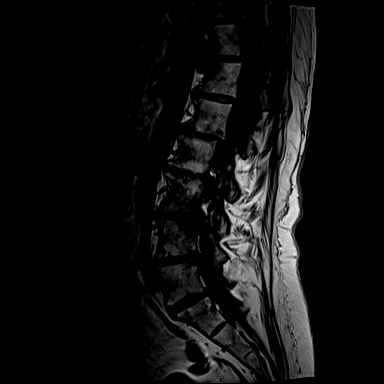
[im 15/15]
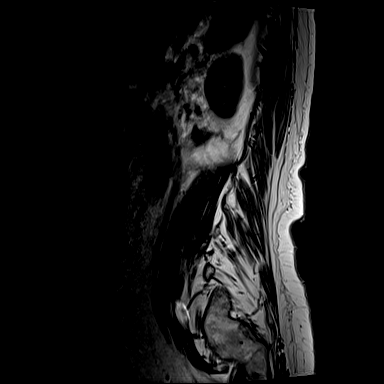

[Series 29: T2 · sagittal · 4.0mm · 0.73mm/px · 6 of 15 slices shown (1 of 2)]
[im 1/15]
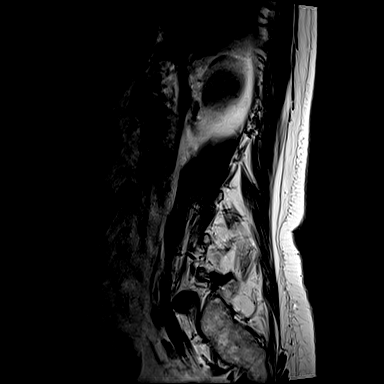
[im 3/15]
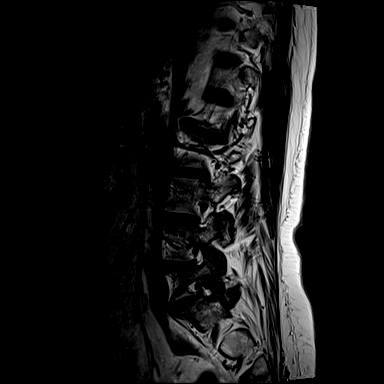
[im 6/15]
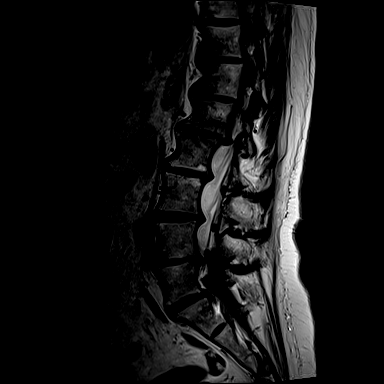
[im 9/15]
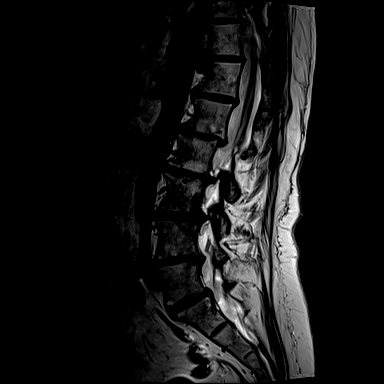
[im 12/15]
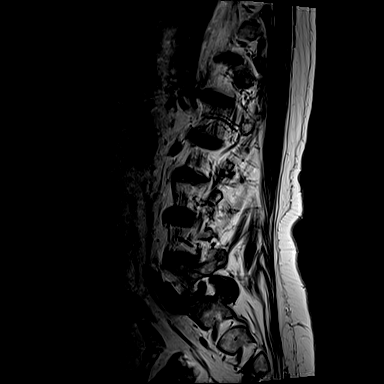
[im 15/15]
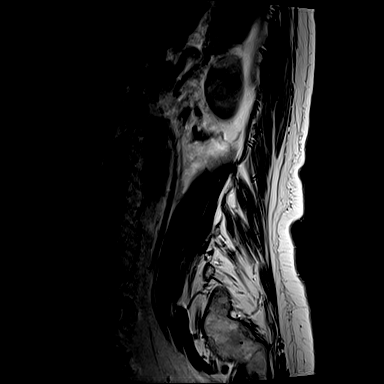

[Series 32: T1 · axial · 4.0mm · 0.35mm/px · z∈[-324,-184]mm · 3 of 36 slices shown (2 of 2)]
[im 6/36]
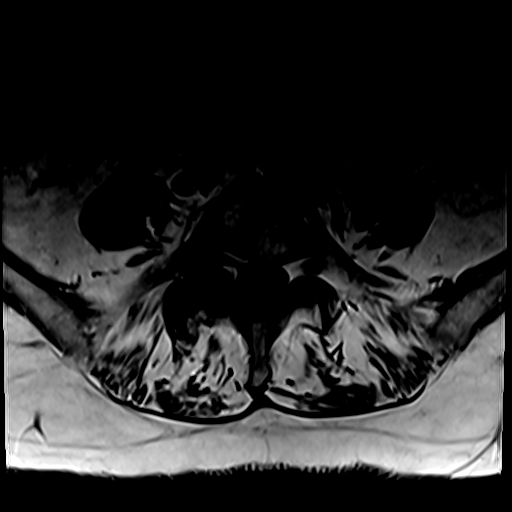
[im 18/36]
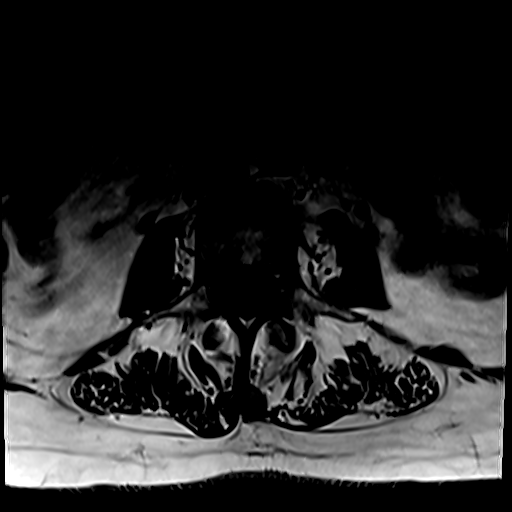
[im 31/36]
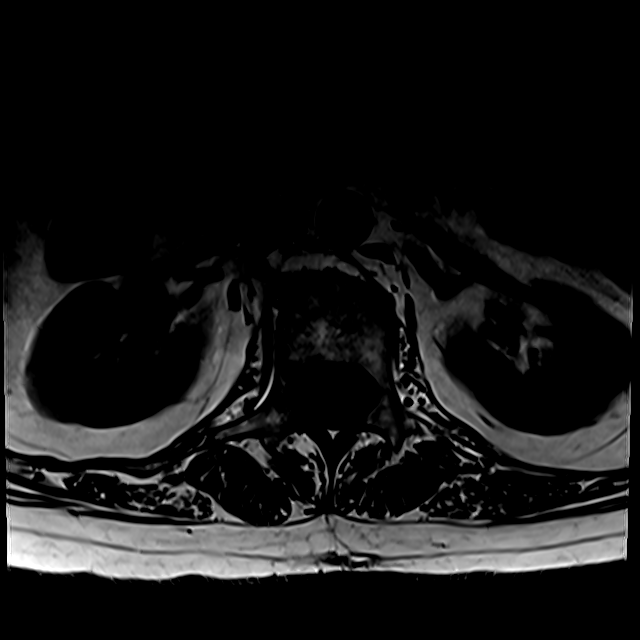

[Series 35: T2 · axial · 4.0mm · 0.35mm/px · z∈[-349,-184]mm · 6 of 36 slices shown (2 of 2)]
[im 1/36]
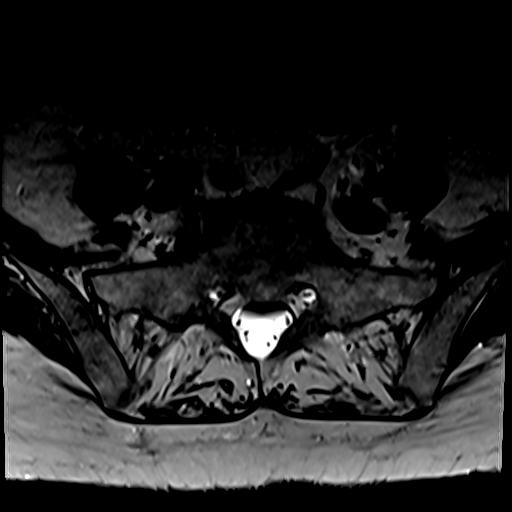
[im 6/36]
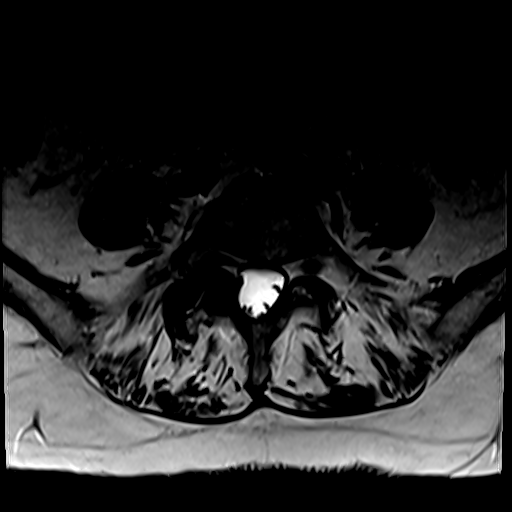
[im 11/36]
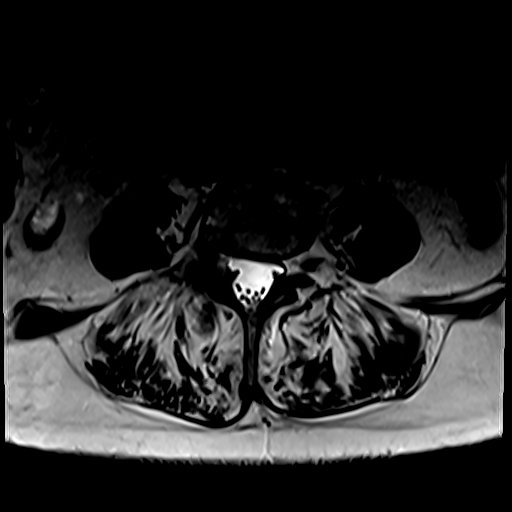
[im 16/36]
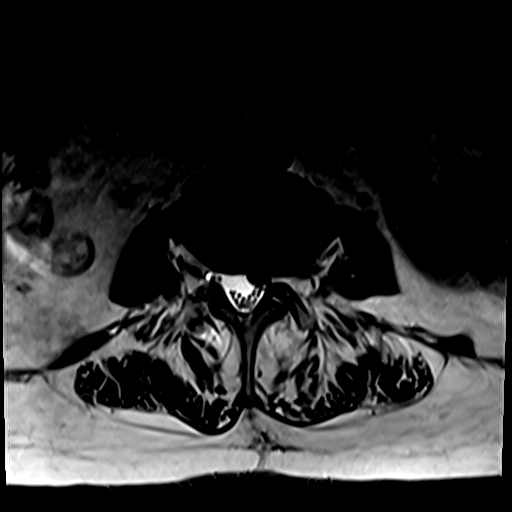
[im 18/36]
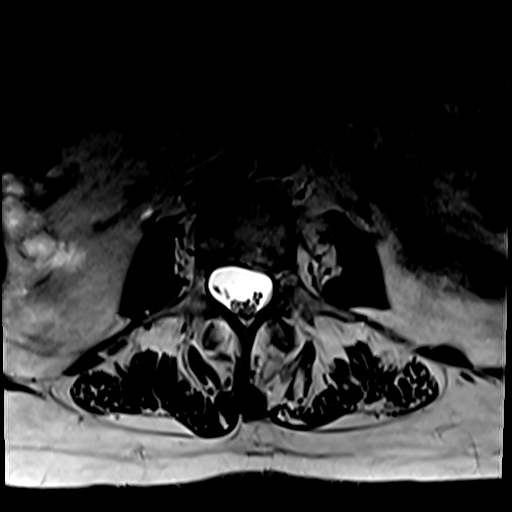
[im 31/36]
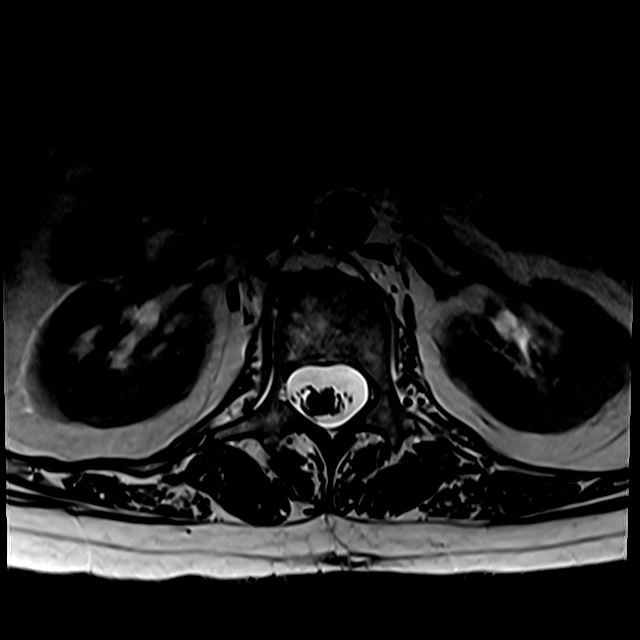

[18 of 48 positions shown; findings below may reference images not displayed]

FINDINGS: Segmentation: 5 lumbar vertebrae. The caudal most well-formed
intervertebral disc space is designated L5-S1.

Alignment: Lumbar dextrocurvature. Trace T12-L1 grade
retrolisthesis. 2 mm L1-L2 grade 1 retrolisthesis. 2 mm L2-L3 grade
1 retrolisthesis. Trace L5-S1 grade 1 anterolisthesis.

Vertebrae: No lumbar vertebral compression fracture. Multilevel
degenerative endplate irregularity and mixed degenerative endplate
marrow signal. Multilevel degenerative endplate edema, greatest at
T12-L1, L1-L2 L3-L4 and L4-L5. Edema within the posterior elements
on the right at L5, likely degenerative related to facet arthrosis.

Conus medullaris and cauda equina: Conus extends to the L2 inferior
endplate level. No signal abnormality within the visualized distal
spinal cord.

Paraspinal and other soft tissues: No abnormality identified within
included portions of the abdomen/retroperitoneum. Atrophy of the
lumbar paraspinal musculature.

Disc levels:

Unless otherwise stated, the level by level findings below have not
significantly changed from the prior MRI of 02/18/2018.

Multilevel disc degeneration, greatest at T12-L1
(moderate/advanced), L1-L2 (advanced), L2-L3 (advanced) and L4-L5
(advanced).

T12-L1: Grade 1 retrolisthesis. Disc bulge asymmetric to the right.
Superimposed right extraforaminal broad-based disc protrusion, new
as compared to the prior examination. Endplate spurring greatest
along the right aspect of the disc space. Mild right subarticular
narrowing with disc bulge possibly contacting the descending right
L1 nerve root (series 35, image 3). Central canal patent. No
significant foraminal stenosis.

L1-L2: Grade 1 retrolisthesis. Disc bulge with endplate
spurring/osteophytic ridging. Mild facet arthrosis. Mild right
subarticular narrowing (without frank nerve root impingement).
Central canal patent. No significant foraminal stenosis.

L2-L3: Grade 1 retrolisthesis. Disc bulge asymmetric to the left.
Superimposed broad-based left central/foraminal disc protrusion.
Associated endplate spurring/osteophytic ridging, greatest to the
left. Moderate facet arthrosis on the left. The disc protrusion
contributes to mild/moderate left subarticular narrowing, with
posterior displacement of the descending left L3 nerve root (series
35, image 14). Mild left neural foraminal narrowing.

L3-L4: Disc bulge with endplate spurring/osteophytic ridging,
greatest along the left aspect of the disc space. Superimposed 6 mm
left subarticular disc extrusion. Mild facet arthrosis. The disc
extrusion contributes to moderate left subarticular stenosis,
encroaching upon descending left-sided nerve roots, most notably the
descending left L4 nerve root (series 35, image 21). Mild relative
narrowing of the central canal. Mild left neural foraminal
narrowing.

L4-L5: Disc bulge with endplate spurring/osteophytic ridging. Mild
facet arthrosis. Mild left-greater-than-right subarticular narrowing
with slight crowding of the descending left L5 nerve root (series
35, image 27). Mild relative right neural foraminal narrowing.

L5-S1: Grade 1 anterolisthesis. Disc uncovering with disc bulge
asymmetric to the right. Associated endplate spurring/osteophytic
ridging, predominantly along the right aspect of the disc space.
Facet arthrosis (moderate right, mild left). No significant spinal
canal stenosis. Bilateral neural foraminal narrowing (mild/moderate
right, mild left).
IMPRESSION: Comparison is made to the prior lumbar spine MRI of 02/18/2018.

At T12-L1, there is a new broad-based right extraforaminal disc
protrusion which does not appear to affect the exiting right T12
nerve root. Unchanged at this level, a disc bulge asymmetric to the
right contributes to mild right subarticular narrowing, possibly
contacting the descending right L1 nerve root.

Multilevel disc degeneration, greatest at T12-L1
(moderate/advanced), L1-L2 (advanced), L2-L3 (advanced) and L4-L5
(advanced). Progressive multilevel degenerative endplate edema,
greatest at T12-L1, L1-L2, L2-L3, L3-L4 and L4-L5.

Lumbar spondylosis has otherwise not significantly changed as
compared to the prior MRI. Additional findings, most notably as
follows.

At L2-L3, a broad-based left central/foraminal disc protrusion
contributes to multifactorial mild/moderate left subarticular
narrowing, with posterior displacement of the descending left L3
nerve root.

At L3-L4, a left subarticular disc extrusion results in moderate
left subarticular stenosis, encroaching upon descending left-sided
nerve roots (most notably the descending left L4 nerve root).

At L4-L5, there is multifactorial mild left-greater-than-right
subarticular narrowing, with slight crowding of the descending left
L5 nerve root.

Multifactorial mild/moderate neural foraminal narrowing on the right
at L5-S1.

Lumbar levocurvature.

Mild multilevel grade 1 spondylolisthesis, as detailed.

## 2023-07-15 IMAGING — MR MR THORACIC SPINE W/O CM
4 of 6 series · 20 of 48 positions shown · non-contrast
Comparison: Thoracic spine MRI 02/18/2018. cervical spine MRI
03/14/2011.

CLINICAL DATA: Thoracic spondylosis without myelopathy. Additional
history provided by scanning technologist: Patient reports back pain
for years, worsening,.

EXAM:
MRI THORACIC SPINE WITHOUT CONTRAST
TECHNIQUE: Multiplanar, multisequence MR imaging of the thoracic spine was
performed. No intravenous contrast was administered.

[Series 17: T1 · sagittal · 3.0mm · 1.00mm/px · 3 of 15 slices shown]
[im 3/15]
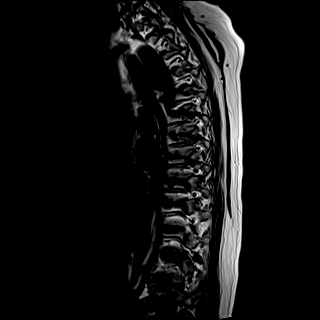
[im 9/15]
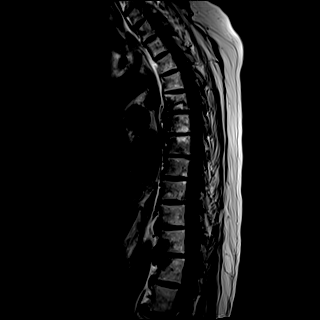
[im 15/15]
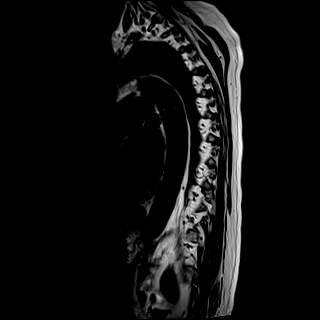

[Series 18: STIR · sagittal · 3.0mm · 1.00mm/px · 3 of 15 slices shown]
[im 3/15]
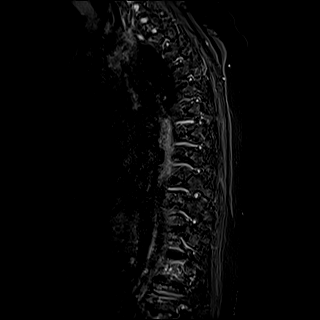
[im 9/15]
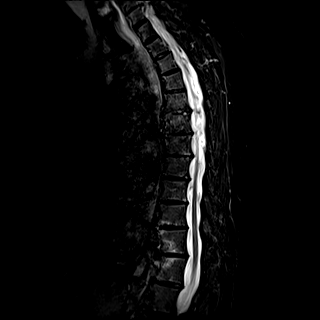
[im 15/15]
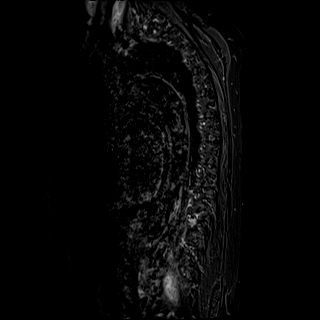

[Series 19: T2 · sagittal · 3.0mm · 0.83mm/px · 6 of 15 slices shown (1 of 2)]
[im 1/15]
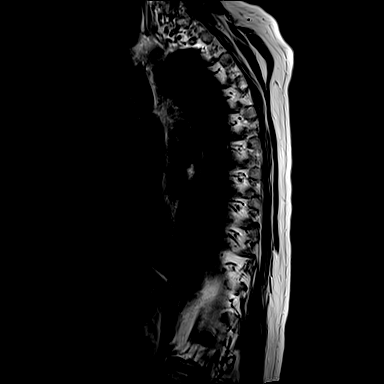
[im 3/15]
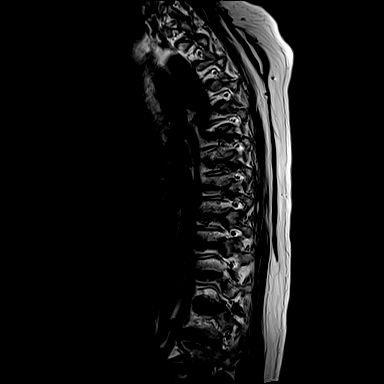
[im 6/15]
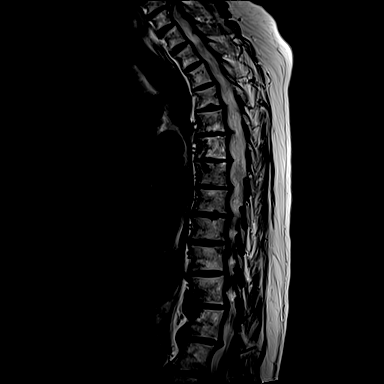
[im 9/15]
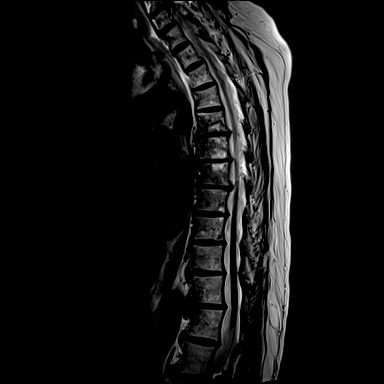
[im 12/15]
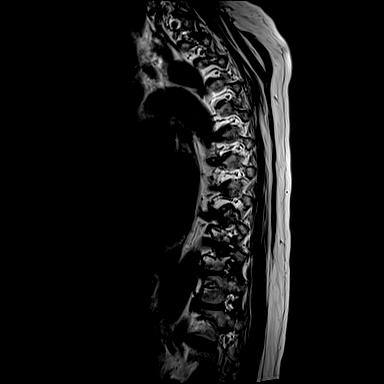
[im 15/15]
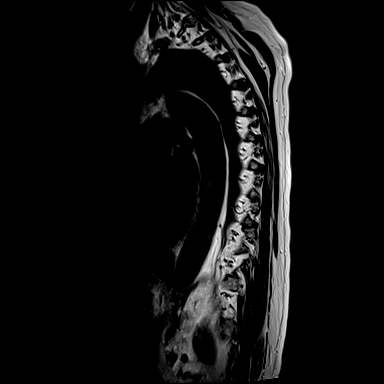

[Series 20: T2 · axial · 4.0mm · 0.28mm/px · z∈[-152,+13]mm · 8 of 36 slices shown (2 of 2)]
[im 1/36]
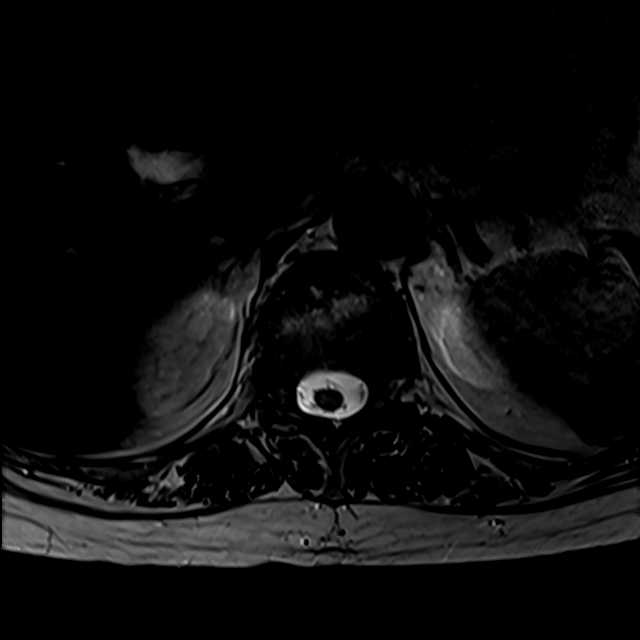
[im 6/36]
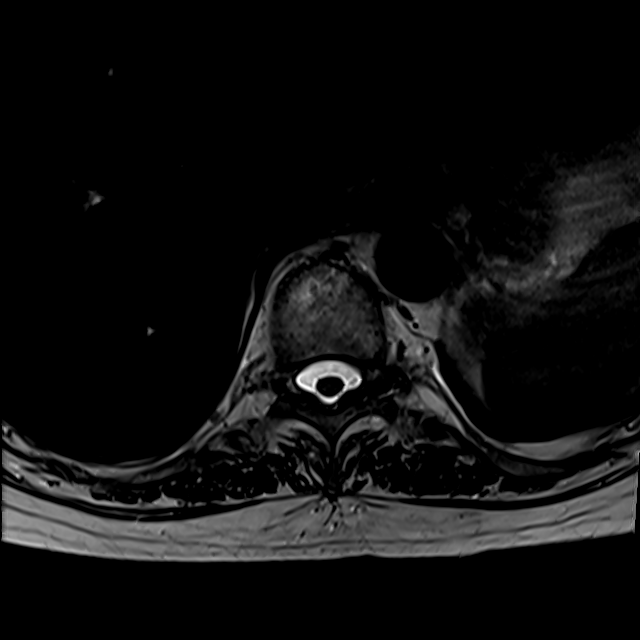
[im 12/36]
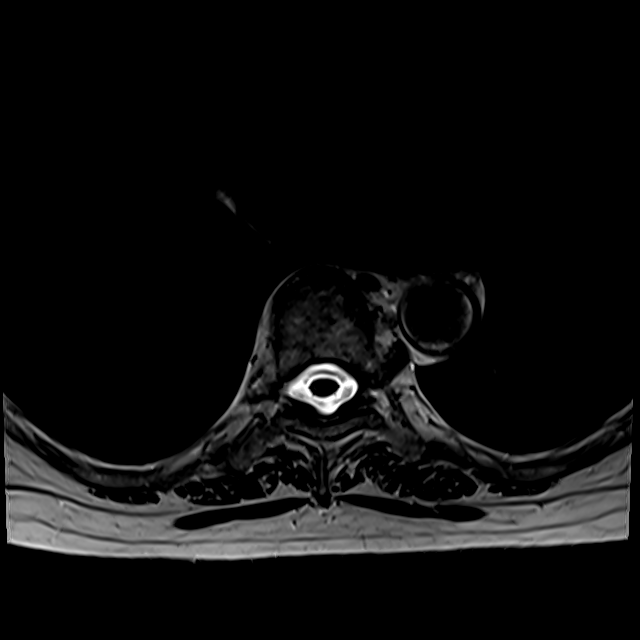
[im 15/36]
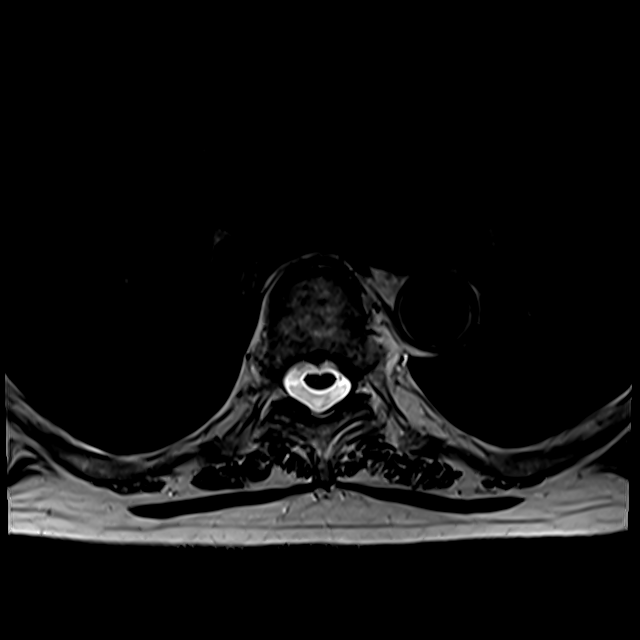
[im 18/36]
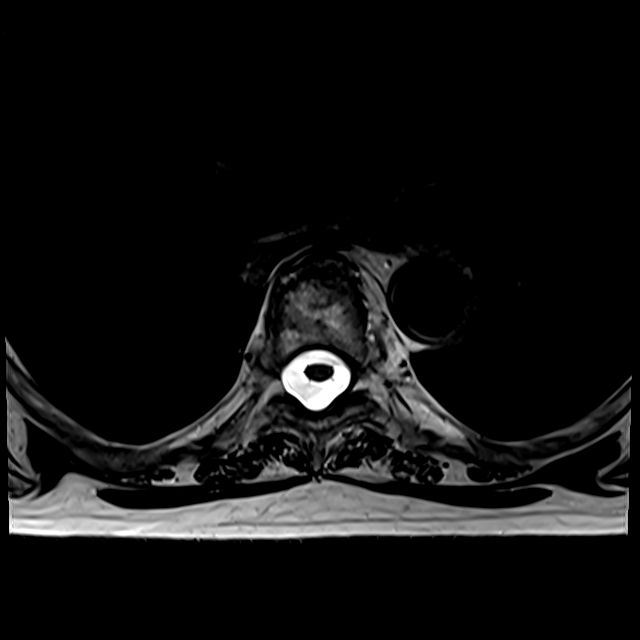
[im 21/36]
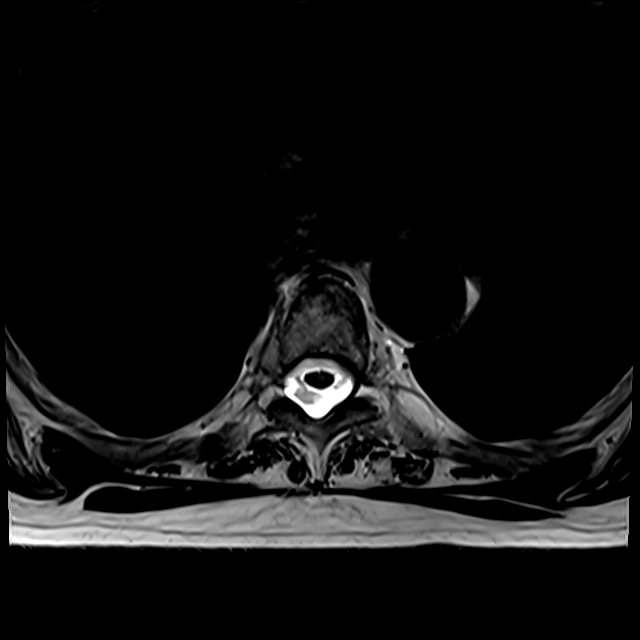
[im 24/36]
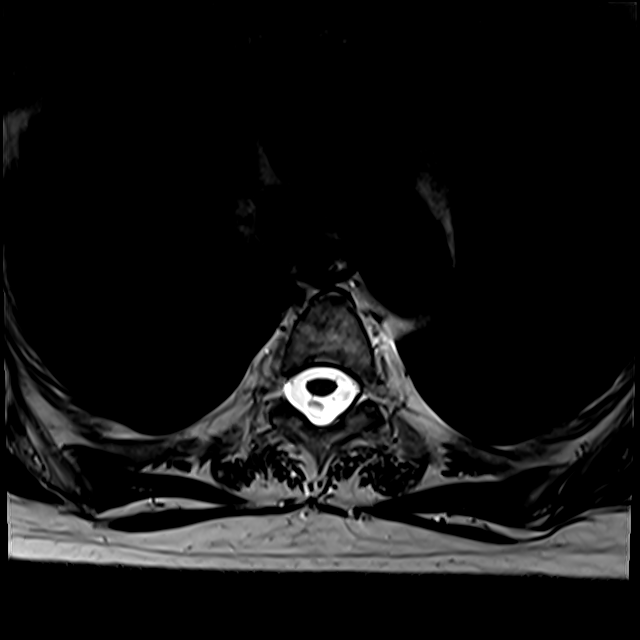
[im 30/36]
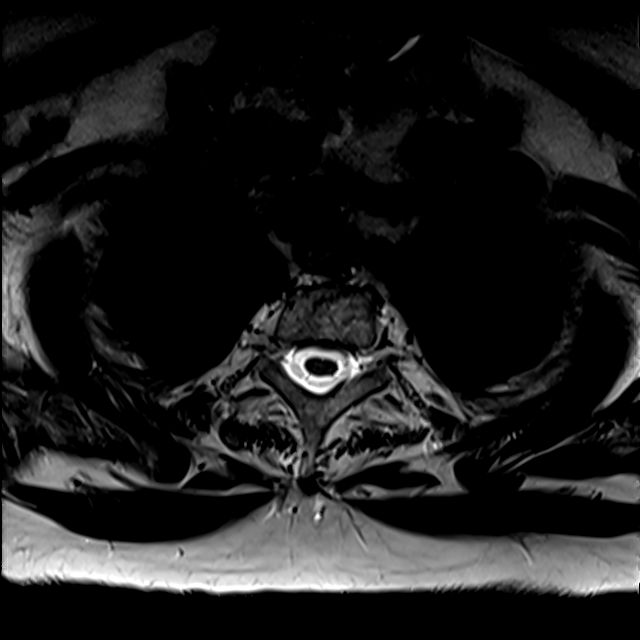

[20 of 48 positions shown; findings below may reference images not displayed]

FINDINGS: Alignment: Mild thoracic levocurvature. Trace T3-T4, T4-T5 and
T11-T12 grade 1 anterolisthesis. Trace T12-L1 grade 1
retrolisthesis.

Vertebrae: No thoracic vertebral compression fracture. Multilevel
degenerative endplate irregularity. Multilevel degenerative endplate
edema, greatest at T5-T6, T10-T11, T11-T12 and L1-L2. Edema within
the left T10 and T11 articular pillars, likely degenerative and
related to facet arthrosis.

Cord:  No spinal cord signal abnormality is identified.

Paraspinal and other soft tissues: No abnormality identified within
included portions of the thorax or upper abdomen/retroperitoneum.
Paraspinal soft tissues unremarkable.

Disc levels:

Unless otherwise stated, the level by level findings below have not
significantly changed from the prior MRI of 02/18/2018.

Multilevel degeneration. Most notably, there is advanced
degeneration at T5-T6 and T6-T7 and moderate/advanced disc
degeneration at T7-T8, T11-T12 and T12-L1.

Multilevel disc bulges with endplate spurring/osteophytic ridging,
contributing to no more than mild spinal canal stenosis. Minimal
multilevel facet arthrosis and ligamentum flavum hypertrophy.
Additional notable level by level findings, as described below.

At T3-T4, there is a broad-based central/right central disc
protrusion. The disc protrusion partly effaces the ventral thecal
sac, contributing to mild spinal canal narrowing (without definite
spinal cord mass effect).

At T7-T8, there is a small central disc protrusion. The disc
protrusion focally effaces the ventral thecal sac, contributing to
mild relative spinal canal narrowing (without definite spinal cord
mass effect).

No compressive thoracic neural foraminal narrowing is identified.

Cervical spondylosis is incompletely assessed on the localizer
sequence. The C6-C7 level is partly included on sagittal imaging. At
this level, there is a disc bulge with superimposed right center
disc protrusion. Endplate spurring with bilateral disc osteophyte
ridge. Apparent mild/moderate spinal canal stenosis with possible
contact upon the ventral spinal cord. The neural foramina are
incompletely included in the field of view.
IMPRESSION: Comparison is made to the prior thoracic spine MRI of 02/18/2018.
Thoracic spondylosis, as outlined with findings most notably as
follows.

Disc degeneration is greatest at T5-T6 and T6-T7 (advanced at these
levels), as well as T7-T8, T11-T12 and T12-L1 (moderate/advanced at
these levels). Progressive multilevel degenerative endplate edema,
greatest at T5-T6, T10-T11, T11-12 and L1-L2.

Thoracic spondylosis has otherwise not significantly changed from
the prior examination. Mild multilevel spinal canal narrowing,
greatest at T3-T4 and T7-T8. No compressive thoracic foraminal
stenosis.

Degenerative edema within the left T10 and T11 articular pillars,
new from the prior exam, and likely related to facet arthrosis at
this site.

Mild thoracic levocurvature. Trace multilevel spondylolisthesis, as
described.

The C6-C7 level is partly included on sagittal imaging. At this
level, a right center disc protrusion contributes to multifactorial
mild/moderate spinal canal stenosis with possible contact upon the
ventral spinal cord.

## 2023-08-05 DIAGNOSIS — M25511 Pain in right shoulder: Secondary | ICD-10-CM | POA: Diagnosis not present

## 2023-09-29 DIAGNOSIS — K080 Exfoliation of teeth due to systemic causes: Secondary | ICD-10-CM | POA: Diagnosis not present

## 2023-10-02 DIAGNOSIS — L814 Other melanin hyperpigmentation: Secondary | ICD-10-CM | POA: Diagnosis not present

## 2023-10-02 DIAGNOSIS — L570 Actinic keratosis: Secondary | ICD-10-CM | POA: Diagnosis not present

## 2023-10-02 DIAGNOSIS — L820 Inflamed seborrheic keratosis: Secondary | ICD-10-CM | POA: Diagnosis not present

## 2023-10-02 DIAGNOSIS — L538 Other specified erythematous conditions: Secondary | ICD-10-CM | POA: Diagnosis not present

## 2023-10-02 DIAGNOSIS — L821 Other seborrheic keratosis: Secondary | ICD-10-CM | POA: Diagnosis not present

## 2023-10-02 DIAGNOSIS — Z08 Encounter for follow-up examination after completed treatment for malignant neoplasm: Secondary | ICD-10-CM | POA: Diagnosis not present

## 2023-10-02 DIAGNOSIS — D225 Melanocytic nevi of trunk: Secondary | ICD-10-CM | POA: Diagnosis not present

## 2023-10-05 DIAGNOSIS — Z0000 Encounter for general adult medical examination without abnormal findings: Secondary | ICD-10-CM | POA: Diagnosis not present

## 2023-10-05 DIAGNOSIS — M25461 Effusion, right knee: Secondary | ICD-10-CM | POA: Diagnosis not present

## 2023-10-05 DIAGNOSIS — Z23 Encounter for immunization: Secondary | ICD-10-CM | POA: Diagnosis not present

## 2023-10-05 DIAGNOSIS — I10 Essential (primary) hypertension: Secondary | ICD-10-CM | POA: Diagnosis not present

## 2023-10-05 DIAGNOSIS — R7303 Prediabetes: Secondary | ICD-10-CM | POA: Diagnosis not present

## 2023-10-05 DIAGNOSIS — E7800 Pure hypercholesterolemia, unspecified: Secondary | ICD-10-CM | POA: Diagnosis not present

## 2023-10-05 DIAGNOSIS — E039 Hypothyroidism, unspecified: Secondary | ICD-10-CM | POA: Diagnosis not present

## 2023-10-12 DIAGNOSIS — J309 Allergic rhinitis, unspecified: Secondary | ICD-10-CM | POA: Diagnosis not present

## 2023-10-12 DIAGNOSIS — R051 Acute cough: Secondary | ICD-10-CM | POA: Diagnosis not present

## 2023-10-23 DIAGNOSIS — J0100 Acute maxillary sinusitis, unspecified: Secondary | ICD-10-CM | POA: Diagnosis not present

## 2023-10-27 DIAGNOSIS — R109 Unspecified abdominal pain: Secondary | ICD-10-CM | POA: Diagnosis not present

## 2023-11-03 DIAGNOSIS — R197 Diarrhea, unspecified: Secondary | ICD-10-CM | POA: Diagnosis not present

## 2023-11-03 DIAGNOSIS — I10 Essential (primary) hypertension: Secondary | ICD-10-CM | POA: Diagnosis not present

## 2023-11-14 DIAGNOSIS — N3001 Acute cystitis with hematuria: Secondary | ICD-10-CM | POA: Diagnosis not present

## 2023-11-15 ENCOUNTER — Emergency Department (HOSPITAL_COMMUNITY)
Admission: EM | Admit: 2023-11-15 | Discharge: 2023-11-15 | Disposition: A | Discharge status: Home / Self Care | Attending: Emergency Medicine | Admitting: Emergency Medicine

## 2023-11-15 ENCOUNTER — Other Ambulatory Visit: Payer: Self-pay

## 2023-11-15 ENCOUNTER — Emergency Department (HOSPITAL_COMMUNITY)

## 2023-11-15 ENCOUNTER — Encounter (HOSPITAL_COMMUNITY): Payer: Self-pay

## 2023-11-15 DIAGNOSIS — K5900 Constipation, unspecified: Secondary | ICD-10-CM | POA: Diagnosis not present

## 2023-11-15 DIAGNOSIS — K429 Umbilical hernia without obstruction or gangrene: Secondary | ICD-10-CM | POA: Diagnosis not present

## 2023-11-15 DIAGNOSIS — Z79899 Other long term (current) drug therapy: Secondary | ICD-10-CM | POA: Insufficient documentation

## 2023-11-15 DIAGNOSIS — Z7982 Long term (current) use of aspirin: Secondary | ICD-10-CM | POA: Insufficient documentation

## 2023-11-15 DIAGNOSIS — R309 Painful micturition, unspecified: Secondary | ICD-10-CM | POA: Diagnosis not present

## 2023-11-15 DIAGNOSIS — E871 Hypo-osmolality and hyponatremia: Secondary | ICD-10-CM | POA: Insufficient documentation

## 2023-11-15 DIAGNOSIS — I10 Essential (primary) hypertension: Secondary | ICD-10-CM | POA: Diagnosis not present

## 2023-11-15 DIAGNOSIS — Z8673 Personal history of transient ischemic attack (TIA), and cerebral infarction without residual deficits: Secondary | ICD-10-CM | POA: Insufficient documentation

## 2023-11-15 DIAGNOSIS — K5730 Diverticulosis of large intestine without perforation or abscess without bleeding: Secondary | ICD-10-CM | POA: Diagnosis not present

## 2023-11-15 DIAGNOSIS — R109 Unspecified abdominal pain: Secondary | ICD-10-CM | POA: Diagnosis not present

## 2023-11-15 DIAGNOSIS — N12 Tubulo-interstitial nephritis, not specified as acute or chronic: Secondary | ICD-10-CM | POA: Diagnosis not present

## 2023-11-15 LAB — CBC WITH DIFFERENTIAL/PLATELET
Abs Immature Granulocytes: 0.03 10*3/uL (ref 0.00–0.07)
Basophils Absolute: 0.1 10*3/uL (ref 0.0–0.1)
Basophils Relative: 1 %
Eosinophils Absolute: 0.1 10*3/uL (ref 0.0–0.5)
Eosinophils Relative: 1 %
HCT: 43.1 % (ref 36.0–46.0)
Hemoglobin: 14.3 g/dL (ref 12.0–15.0)
Immature Granulocytes: 0 %
Lymphocytes Relative: 21 %
Lymphs Abs: 2 10*3/uL (ref 0.7–4.0)
MCH: 29.8 pg (ref 26.0–34.0)
MCHC: 33.2 g/dL (ref 30.0–36.0)
MCV: 89.8 fL (ref 80.0–100.0)
Monocytes Absolute: 0.7 10*3/uL (ref 0.1–1.0)
Monocytes Relative: 7 %
Neutro Abs: 6.7 10*3/uL (ref 1.7–7.7)
Neutrophils Relative %: 70 %
Platelets: 290 10*3/uL (ref 150–400)
RBC: 4.8 MIL/uL (ref 3.87–5.11)
RDW: 11.9 % (ref 11.5–15.5)
WBC: 9.5 10*3/uL (ref 4.0–10.5)
nRBC: 0 % (ref 0.0–0.2)

## 2023-11-15 LAB — COMPREHENSIVE METABOLIC PANEL WITH GFR
ALT: 20 U/L (ref 0–44)
AST: 19 U/L (ref 15–41)
Albumin: 4 g/dL (ref 3.5–5.0)
Alkaline Phosphatase: 75 U/L (ref 38–126)
Anion gap: 10 (ref 5–15)
BUN: 11 mg/dL (ref 8–23)
CO2: 23 mmol/L (ref 22–32)
Calcium: 9.5 mg/dL (ref 8.9–10.3)
Chloride: 95 mmol/L — ABNORMAL LOW (ref 98–111)
Creatinine, Ser: 0.8 mg/dL (ref 0.44–1.00)
GFR, Estimated: >60 mL/min (ref >60)
Glucose, Bld: 156 mg/dL — ABNORMAL HIGH (ref 70–99)
Potassium: 4.4 mmol/L (ref 3.5–5.1)
Sodium: 128 mmol/L — ABNORMAL LOW (ref 135–145)
Total Bilirubin: 0.9 mg/dL (ref 0.0–1.2)
Total Protein: 6.9 g/dL (ref 6.5–8.1)

## 2023-11-15 LAB — URINALYSIS, ROUTINE W REFLEX MICROSCOPIC
Bacteria, UA: NONE SEEN
Bilirubin Urine: NEGATIVE
Glucose, UA: NEGATIVE mg/dL
Ketones, ur: NEGATIVE mg/dL
Nitrite: NEGATIVE
Protein, ur: NEGATIVE mg/dL
Specific Gravity, Urine: 1.011 (ref 1.005–1.030)
pH: 6 (ref 5.0–8.0)

## 2023-11-15 LAB — LIPASE, BLOOD: Lipase: 37 U/L (ref 11–51)

## 2023-11-15 MED ORDER — SODIUM CHLORIDE 0.9 % IV SOLN
1.0000 g | Freq: Once | INTRAVENOUS | Status: AC
  Administered 2023-11-15: 1 g via INTRAVENOUS
  Filled 2023-11-15: qty 10

## 2023-11-15 MED ORDER — ONDANSETRON 4 MG PO TBDP: 4.0000 mg | ORAL_TABLET | Freq: Three times a day (TID) | ORAL | 0 refills | Status: DC | PRN

## 2023-11-15 MED ORDER — MORPHINE SULFATE (PF) 4 MG/ML IV SOLN
4.0000 mg | Freq: Once | INTRAVENOUS | Status: AC
  Administered 2023-11-15: 4 mg via INTRAVENOUS
  Filled 2023-11-15: qty 1

## 2023-11-15 MED ORDER — ONDANSETRON HCL 4 MG/2ML IJ SOLN
4.0000 mg | Freq: Once | INTRAMUSCULAR | Status: AC
  Administered 2023-11-15: 4 mg via INTRAVENOUS
  Filled 2023-11-15: qty 2

## 2023-11-15 MED ORDER — OXYCODONE-ACETAMINOPHEN 5-325 MG PO TABS
1.0000 | ORAL_TABLET | Freq: Four times a day (QID) | ORAL | 0 refills | Status: DC | PRN
Start: 2023-11-15 — End: 2023-12-09

## 2023-11-15 MED ORDER — CEFUROXIME AXETIL 500 MG PO TABS: 500.0000 mg | ORAL_TABLET | Freq: Two times a day (BID) | ORAL | 0 refills | Status: AC

## 2023-11-15 MED ORDER — MORPHINE SULFATE (PF) 4 MG/ML IV SOLN
4.0000 mg | Freq: Once | INTRAVENOUS | Status: DC
  Filled 2023-11-15: qty 1

## 2023-11-15 MED ORDER — ACETAMINOPHEN 325 MG PO TABS
650.0000 mg | ORAL_TABLET | Freq: Four times a day (QID) | ORAL | Status: DC | PRN
  Administered 2023-11-15: 650 mg via ORAL
  Filled 2023-11-15: qty 2

## 2023-11-15 MED ORDER — OXYCODONE-ACETAMINOPHEN 5-325 MG PO TABS
1.0000 | ORAL_TABLET | Freq: Once | ORAL | Status: AC
  Administered 2023-11-15: 1 via ORAL
  Filled 2023-11-15: qty 1

## 2023-11-15 NOTE — ED Triage Notes (Signed)
 Pt was recently dx UTI with hematuria. Pt was prescribed abx. Pt states she feel like she is in a daze and not able to lay down d/t increase back pain. Pt daughter at bedside feels the pt condition is worsening.   Pt appetite has decreased. Pt denies fever and endorses chills.

## 2023-11-15 NOTE — ED Provider Notes (Signed)
 [place] EMERGENCY DEPARTMENT AT [facility] Provider Note   CSN: [id number] Arrival date & time: 11/15/23  1331     History  Chief Complaint  Patient presents with   Urinary Tract Infection    Jasmine Smith is a 88 y.o. female.   Urinary Tract Infection    Patient has a history of hyperlipidemia PVC acid reflux CVA hypertension anxiety.  Patient states she has had urinary discomfort that started the other day.  She went to an urgent care and was diagnosed with a severe UTI.  Patient was prescribed Augmentin.  Patient is having pain in her back.  Her symptoms increased today she started to feel shaky.  She has not had any fever.  Home Medications Prior to Admission medications   Medication Sig Start Date End Date Taking? Authorizing Provider  cefUROXime  (CEFTIN ) 500 MG tablet Take 1 tablet (500 mg total) by mouth 2 (two) times daily with a meal for 10 days. 11/15/23 11/25/23 Yes Trish Furl, MD  ondansetron  (ZOFRAN -ODT) 4 MG disintegrating tablet Take 1 tablet (4 mg total) by mouth every 8 (eight) hours as needed for nausea or vomiting. 11/15/23  Yes Trish Furl, MD  oxyCODONE -acetaminophen  (PERCOCET/ROXICET) 5-325 MG tablet Take 1 tablet by mouth every 6 (six) hours as needed for severe pain (pain score 7-10). 11/15/23  Yes Trish Furl, MD  amLODipine  (NORVASC ) 5 MG tablet Take 5 mg by mouth daily.  09/23/13   [provider]  aspirin  81 MG tablet Take 162 mg by mouth daily.     [provider]  Biotin 5000 MCG CAPS Take 5,000 mcg by mouth daily.    [provider]  Carboxymethylcellulose Sodium (THERATEARS OP) Place 1 drop into both eyes in the morning, at noon, and at bedtime.    [provider]  diclofenac Sodium (VOLTAREN ARTHRITIS PAIN) 1 % GEL Apply 2 g topically daily as needed (pain).    [provider]  esomeprazole (NEXIUM) 20 MG capsule Take 20 mg by mouth daily at 12 noon.    [provider]  gabapentin   (NEURONTIN ) 300 MG capsule Take 300 mg by mouth 3 (three) times daily. 04/02/22   [provider]  KRILL OIL PO Take 1 capsule by mouth daily.      [provider]  levothyroxine  (SYNTHROID ) 50 MCG tablet Take 50 mcg by mouth daily before breakfast. 03/03/22   [provider]  LORazepam  (ATIVAN ) 0.5 MG tablet Take 0.5 mg by mouth 2 (two) times daily. 09/05/13   [provider]  losartan  (COZAAR ) 100 MG tablet Take 100 mg by mouth daily.  09/17/13   [provider]  methocarbamol  (ROBAXIN ) 500 MG tablet Take 1 tablet (500 mg total) by mouth every 6 (six) hours as needed for muscle spasms. 02/06/23   Porterfield, Amber, PA-C  Methylcellulose, Laxative, (CITRUCEL PO) Take 2 Scoops by mouth at bedtime.    [provider]  metoprolol  succinate (TOPROL -XL) 25 MG 24 hr tablet Take 12.5 mg by mouth daily. 03/03/22   [provider]  Multiple Vitamin (MULTIVITAMIN) tablet Take 1 tablet by mouth daily.      [provider]  oxyCODONE  (OXY IR/ROXICODONE ) 5 MG immediate release tablet Take 1 tablet (5 mg total) by mouth every 4 (four) hours as needed for moderate pain (pain score 4-6). 02/06/23   Porterfield, Amber, PA-C  polyethylene glycol (MIRALAX  / GLYCOLAX ) 17 g packet Take 17 g by mouth at bedtime.    [provider]  rosuvastatin  (CRESTOR ) 5 MG tablet Take 5 mg by mouth daily. 04/01/22   [provider]      Allergies    Clarithromycin, Levaquin  [levofloxacin in d5w], Levofloxacin, Levofloxacin, Metronidazole, and Tramadol hcl    Review of Systems   Review of Systems  Physical Exam Updated Vital Signs BP (!) 120/94   Pulse 63   Temp 97.7 F (36.5 C) (Oral)   Resp 15   Ht 1.575 m (5\' 2" )   Wt 65.3 kg   SpO2 99%   BMI 26.34 kg/m  Physical Exam Vitals and nursing note reviewed.  Constitutional:      General: She is not in acute distress.    Appearance: She is well-developed.  HENT:     Head: Normocephalic and  atraumatic.     Right Ear: External ear normal.     Left Ear: External ear normal.  Eyes:     General: No scleral icterus.       Right eye: No discharge.        Left eye: No discharge.     Conjunctiva/sclera: Conjunctivae normal.  Neck:     Trachea: No tracheal deviation.  Cardiovascular:     Rate and Rhythm: Normal rate and regular rhythm.  Pulmonary:     Effort: Pulmonary effort is normal. No respiratory distress.     Breath sounds: Normal breath sounds. No stridor. No wheezing or rales.  Abdominal:     General: Bowel sounds are normal. There is no distension.     Palpations: Abdomen is soft.     Tenderness: There is abdominal tenderness. There is no guarding or rebound.     Comments: Tenderness epigastrium right upper quadrant  Musculoskeletal:        General: No tenderness or deformity.     Cervical back: Neck supple.  Skin:    General: Skin is warm and dry.     Findings: No rash.  Neurological:     General: No focal deficit present.     Mental Status: She is alert.     Cranial Nerves: No cranial nerve deficit, dysarthria or facial asymmetry.     Sensory: No sensory deficit.     Motor: No abnormal muscle tone or seizure activity.     Coordination: Coordination normal.  Psychiatric:        Mood and Affect: Mood normal.     ED Results / Procedures / Treatments   Labs (all labs ordered are listed, but only abnormal results are displayed) Labs Reviewed  COMPREHENSIVE METABOLIC PANEL WITH GFR - Abnormal; Notable for the following components:      Result Value   Sodium 128 (*)    Chloride 95 (*)    Glucose, Bld 156 (*)    All other components within normal limits  URINALYSIS, ROUTINE W REFLEX MICROSCOPIC - Abnormal; Notable for the following components:   APPearance HAZY (*)    Hgb urine dipstick MODERATE (*)    Leukocytes,Ua MODERATE (*)    All other components within normal limits  URINE CULTURE  CBC WITH DIFFERENTIAL/PLATELET  LIPASE, BLOOD     EKG None  Radiology CT Renal Stone Study Result Date: 11/15/2023 CLINICAL DATA:  Abdominal/flank pain, stone suspected. EXAM: CT ABDOMEN AND PELVIS WITHOUT CONTRAST TECHNIQUE: Multidetector CT imaging of the abdomen and pelvis was performed following the standard protocol without IV contrast. RADIATION DOSE REDUCTION: This exam was performed according to the departmental dose-optimization program which includes automated exposure control, adjustment of  the mA and/or kV according to patient size and/or use of iterative reconstruction technique. COMPARISON:  02/04/2022. FINDINGS: Lower chest: A few scattered ground-glass opacities are noted in the lower lobes bilaterally. Hepatobiliary: No focal liver abnormality is seen. Status post cholecystectomy. No biliary dilatation. Pancreas: Unremarkable. No pancreatic ductal dilatation or surrounding inflammatory changes. Spleen: Normal in size without focal abnormality. Adrenals/Urinary Tract: The adrenal glands are within normal limits. No renal or ureteral calculus or obstructive uropathy is seen bilaterally. The bladder is unremarkable. Stomach/Bowel: Stomach is within normal limits. Appendix is not seen. No evidence of bowel wall thickening, distention, or inflammatory changes. No free air or pneumatosis is seen. Scattered diverticula are present along the colon without evidence of diverticulitis. There is a moderate amount of retained stool in the colon. Vascular/Lymphatic: Aortic atherosclerosis. No enlarged abdominal or pelvic lymph nodes. Reproductive: Status post hysterectomy. No adnexal masses. Other: No abdominopelvic ascites. There is a small fat containing umbilical hernia. Musculoskeletal: Degenerative changes are present in the thoracolumbar spine. No acute osseous abnormality is seen. IMPRESSION: 1. Peripheral scattered ground-glass opacities at the lung bases bilaterally, may be infectious or inflammatory. 2. No renal or ureteral calculus or  obstructive uropathy bilaterally. 3. Diverticulosis without diverticulitis. 4. Moderate amount of retained stool in the colon suggesting constipation. 5. Aortic atherosclerosis. Electronically Signed   By: Wyvonnia Heimlich M.D.   On: 11/15/2023 15:56    Procedures Procedures    Medications Ordered in ED Medications  acetaminophen  (TYLENOL ) tablet 650 mg (650 mg Oral Given 11/15/23 1418)  morphine  (PF) 4 MG/ML injection 4 mg (4 mg Intravenous Given 11/15/23 1816)  cefTRIAXone  (ROCEPHIN ) 1 g in sodium chloride  0.9 % 100 mL IVPB (0 g Intravenous Stopped 11/15/23 1924)  ondansetron  (ZOFRAN ) injection 4 mg (4 mg Intravenous Given 11/15/23 1815)  oxyCODONE -acetaminophen  (PERCOCET/ROXICET) 5-325 MG per tablet 1 tablet (1 tablet Oral Given 11/15/23 1949)    ED Course/ Medical Decision Making/ A&P Clinical Course as of 11/15/23 2210  Sun Nov 15, 2023  1717 CBC normal.  Metabolic panel shows hyponatremia with a sodium of 128, new compared to previous values. [JK]  1717 CT scan shows scattered groundglass opacities at the lung base.  No signs of kidney stone.  Stool noted in the colon suggesting constipation [JK]  1854 Urinalysis consistent with UTI [JK]    Clinical Course User Index [JK] Trish Furl, MD                                 Medical Decision Making Amount and/or Complexity of Data Reviewed Labs: ordered.  Risk Prescription drug management.   Patient presented to the ED for evaluation of back pain in the setting of recent UTI diagnosis.  Patient's ED workup reassuring.  She does not have any findings to suggest systemic infection.  No leukocytosis.  No hepatitis or pancreatitis.  No fever in the ED.  Patient's urinalysis is consistent with infection.  CT scan does not show any acute abnormality.  Constipation findings noted discussed with patient.  Patient was given a dose of IV antibiotics as well as medications for pain.  Will plan on discharge home with medications for pain.  It is  possible that the Augmentin antibiotic would still be effective but considering her worsening symptoms we will change her to an alternative antibiotic.  Evaluation and diagnostic testing in the emergency department does not suggest an emergent condition requiring admission or immediate intervention beyond  what has been performed at this time.  The patient is safe for discharge and has been instructed to return immediately for worsening symptoms, change in symptoms or any other concerns.        Final Clinical Impression(s) / ED Diagnoses Final diagnoses:  Pyelonephritis    Rx / DC Orders ED Discharge Orders          Ordered    cefUROXime  (CEFTIN ) 500 MG tablet  2 times daily with meals        11/15/23 1946    oxyCODONE -acetaminophen  (PERCOCET/ROXICET) 5-325 MG tablet  Every 6 hours PRN        11/15/23 1946    ondansetron  (ZOFRAN -ODT) 4 MG disintegrating tablet  Every 8 hours PRN        11/15/23 1946              Trish Furl, MD 11/15/23 2210

## 2023-11-15 NOTE — ED Provider Triage Note (Signed)
 Emergency Medicine Provider Triage Evaluation Note  Jasmine Smith , a 88 y.o. female  was evaluated in triage.  Pt complains of urinary tract infection, flank pain.  Patient and daughter at bedside reports this is been ongoing for several days and was started on antibiotics yesterday.  Patient reports that the pain in her right flank has been notably worsening.  No fevers, chills, bodies, nausea, vomiting, but having a hard time finding a comfortable position to lie down.  They report that the urine is dark but no obvious blood present.  Review of Systems  Positive: As above Negative: As above  Physical Exam  BP (!) 158/56   Pulse 66   Temp 98 F (36.7 C)   Resp 16   Ht 5\' 2"  (1.575 m)   Wt 65.3 kg   SpO2 96%   BMI 26.34 kg/m  Gen:   Awake, no distress   Resp:  Normal effort  MSK:   Moves extremities without difficulty  Other:  Right CVA tenderness, no abdominal tenderness  Medical Decision Making  Medically screening exam initiated at 2:21 PM.  Appropriate orders placed.  Jasmine Smith was informed that the remainder of the evaluation will be completed by another provider, this initial triage assessment does not replace that evaluation, and the importance of remaining in the ED until their evaluation is complete.     Jasmine Smith A, PA-C 11/15/23 1422

## 2023-11-15 NOTE — Discharge Instructions (Addendum)
 Increase your MiraLAX  to twice daily.  Take the medications for pain and nausea.  You can start taking the Ceftin  antibiotic instead of the Augmentin.  Follow-up with your doctor to be rechecked to make sure your symptoms are improving.

## 2023-11-15 NOTE — ED Notes (Signed)
 Patient transported to CT

## 2023-11-16 LAB — URINE CULTURE: Culture: NO GROWTH

## 2023-11-24 DIAGNOSIS — R109 Unspecified abdominal pain: Secondary | ICD-10-CM | POA: Diagnosis not present

## 2023-11-24 DIAGNOSIS — K5900 Constipation, unspecified: Secondary | ICD-10-CM | POA: Diagnosis not present

## 2023-11-24 DIAGNOSIS — Z87440 Personal history of urinary (tract) infections: Secondary | ICD-10-CM | POA: Diagnosis not present

## 2023-12-01 DIAGNOSIS — E871 Hypo-osmolality and hyponatremia: Secondary | ICD-10-CM | POA: Diagnosis not present

## 2023-12-05 DIAGNOSIS — N3000 Acute cystitis without hematuria: Secondary | ICD-10-CM | POA: Diagnosis not present

## 2023-12-05 DIAGNOSIS — R300 Dysuria: Secondary | ICD-10-CM | POA: Diagnosis not present

## 2023-12-08 NOTE — Progress Notes (Unsigned)
 12/09/2023 Jasmine Smith [id number] [birth year]  Referring provider: Roselind Congo, MD Primary GI doctor: Dr. Venice Gillis  ASSESSMENT AND PLAN:  Chronic idiopathic constipation 11/15/2023 CT renal stone diverticulosis without diverticulitis moderate stool On thyroid  medication, has had increase in gabapentin , decreased movement She is on miralax  daily, increased to twice a day, citracel 2 TBSP, was helping but no longer helping but continues to have AB tenderness and constipation/small stools, 2-3 x a day, long strings.  - Increase fiber/ water  intake, decrease caffeine, increase activity level. -Will add on Linzess  has at home, consider increasing -possible component of pelvis floor dysfunction with history and symptoms.  -Can refer to pelvic floor PT has urine issues too - follow up with 6 weeks  Personal history of adenomatous polyps 10/08/2022 colonoscopy Dr. Sandrea Cruel for hematochezia and constipation 7 polyps 6 to 10 mm descending transverse and ascending colon, moderate diverticulosis left colon internal hemorrhoids, 6 polyps tubular adenomatous polyps no recall due to age.  Hypothryoid On thryoid medications Stop the biotin 2 weeks before lab testing  Patient Care Team: Roselind Congo, MD as PCP - General (Family Medicine) Domingo Friend, PA-C as Physician Assistant (Physician Assistant)  HISTORY OF PRESENT ILLNESS: 88 y.o. female with a past medical history listed below presents for evaluation of constipation.   Discussed the use of AI scribe software for clinical note transcription with the patient, who gave verbal consent to proceed.  History of Present Illness   Jasmine Smith is an 88 year old female with diverticulosis and constipation who presents with ongoing constipation and abdominal pain.  She has been experiencing constipation and abdominal pain for over a year, describing her bowel movements as 'little strips' or 'tiny worms like things'. She often  feels like some stool remains in her colon. Despite increasing her MiraLAX  intake to twice daily, which initially provided relief, it is no longer effective. She also takes Citrucel but still experiences incomplete bowel movements.  Her past medical history includes a colonoscopy in March 2024, during which seven small polyps were removed, and she was diagnosed with diverticulosis and hemorrhoids. A recent CT scan for a renal stone study showed diverticulosis and moderate stool in the colon. She denies any blood in her stool but reports some weight loss and a decreased appetite. She has a history of UTIs and kidney infections.  Her current medications include thyroid  medication, gabapentin  300 mg three times a day for neuropathy, and biotin. She has been on gabapentin  for a couple of years, with an increased dose about a year ago. She acknowledges that her physical activity has decreased with age, but she remains active with housework and cooking. She is also on MiraLAX  and Citrucel for constipation management.  During the review of symptoms, she denies heartburn, nausea, and significant abdominal pain, but notes soreness and discomfort in her abdomen. She reports having small, frequent bowel movements, sometimes up to five times a day, but they are incomplete.        She  reports that she has never smoked. She has never used smokeless tobacco. She reports that she does not drink alcohol and does not use drugs.  RELEVANT GI HISTORY, IMAGING AND LABS: Results   RADIOLOGY Abdominal CT: Diverticulosis, moderate stool, no masses, no lymphadenopathy (10/2023)  DIAGNOSTIC Colonoscopy: Seven polyps removed, diverticulosis, hemorrhoids (09/2022)      CBC    Component Value Date/Time   WBC 9.5 11/15/2023 1429   RBC 4.80 11/15/2023 1429  HGB 14.3 11/15/2023 1429   HCT 43.1 11/15/2023 1429   PLT 290 11/15/2023 1429   MCV 89.8 11/15/2023 1429   MCH 29.8 11/15/2023 1429   MCHC 33.2 11/15/2023  1429   RDW 11.9 11/15/2023 1429   LYMPHSABS 2.0 11/15/2023 1429   MONOABS 0.7 11/15/2023 1429   EOSABS 0.1 11/15/2023 1429   BASOSABS 0.1 11/15/2023 1429   Recent Labs    01/27/23 1034 11/15/23 1429  HGB 13.8 14.3    CMP     Component Value Date/Time   NA 128 (L) 11/15/2023 1429   K 4.4 11/15/2023 1429   CL 95 (L) 11/15/2023 1429   CO2 23 11/15/2023 1429   GLUCOSE 156 (H) 11/15/2023 1429   BUN 11 11/15/2023 1429   CREATININE 0.80 11/15/2023 1429   CALCIUM  9.5 11/15/2023 1429   PROT 6.9 11/15/2023 1429   ALBUMIN 4.0 11/15/2023 1429   AST 19 11/15/2023 1429   ALT 20 11/15/2023 1429   ALKPHOS 75 11/15/2023 1429   BILITOT 0.9 11/15/2023 1429   GFRNONAA >60 11/15/2023 1429   GFRAA >60 01/06/2018 0635      Latest Ref Rng & Units 11/15/2023    2:29 PM 02/04/2022   12:35 PM 01/05/2018    8:47 PM  Hepatic Function  Total Protein 6.5 - 8.1 g/dL 6.9  7.5  7.0   Albumin 3.5 - 5.0 g/dL 4.0  4.4  3.9   AST 15 - 41 U/L 19  19  19    ALT 0 - 44 U/L 20  16  20    Alk Phosphatase 38 - 126 U/L 75  51  56   Total Bilirubin 0.0 - 1.2 mg/dL 0.9  0.7  1.1   Bilirubin, Direct 0.0 - 0.2 mg/dL  <6.0  0.2       Current Medications:   Current Outpatient Medications (Endocrine & Metabolic):    levothyroxine  (SYNTHROID ) 50 MCG tablet, Take 50 mcg by mouth daily before breakfast.  Current Outpatient Medications (Cardiovascular):    amLODipine  (NORVASC ) 5 MG tablet, Take 5 mg by mouth daily.    losartan  (COZAAR ) 100 MG tablet, Take 100 mg by mouth daily.    metoprolol  succinate (TOPROL -XL) 25 MG 24 hr tablet, Take 12.5 mg by mouth daily.   rosuvastatin  (CRESTOR ) 5 MG tablet, Take 5 mg by mouth daily.   Current Outpatient Medications (Analgesics):    aspirin  81 MG tablet, Take 162 mg by mouth daily.    Current Outpatient Medications (Other):    Biotin 5000 MCG CAPS, Take 5,000 mcg by mouth daily.   busPIRone (BUSPAR) 10 MG tablet, Take 10 mg by mouth 2 (two) times daily.    Carboxymethylcellulose Sodium (THERATEARS OP), Place 1 drop into both eyes in the morning, at noon, and at bedtime.   diclofenac Sodium (VOLTAREN ARTHRITIS PAIN) 1 % GEL, Apply 2 g topically daily as needed (pain).   esomeprazole (NEXIUM) 20 MG capsule, Take 20 mg by mouth daily at 12 noon.   gabapentin  (NEURONTIN ) 300 MG capsule, Take 300 mg by mouth 3 (three) times daily.   KRILL OIL PO, Take 1 capsule by mouth daily.     methocarbamol  (ROBAXIN ) 500 MG tablet, Take 1 tablet (500 mg total) by mouth every 6 (six) hours as needed for muscle spasms.   Methylcellulose, Laxative, (CITRUCEL PO), Take 2 Scoops by mouth at bedtime.   Multiple Vitamin (MULTIVITAMIN) tablet, Take 1 tablet by mouth daily.     nitrofurantoin, macrocrystal-monohydrate, (MACROBID) 100 MG capsule, Take  100 mg by mouth 2 (two) times daily.   polyethylene glycol (MIRALAX  / GLYCOLAX ) 17 g packet, Take 17 g by mouth at bedtime.  Medical History:  Past Medical History:  Diagnosis Date   Anxiety    Arthritis    CVA (cerebral infarction)    pt stated MRI 2014 showed mini strokes   Diverticulosis    Esophageal reflux    GERD (gastroesophageal reflux disease)    Hyperlipidemia    Hypertension    Hypothyroidism    PVC (premature ventricular contraction)    Tubular adenoma of colon 05/2015   Allergies:  Allergies  Allergen Reactions   Clarithromycin Nausea Only and Other (See Comments)   Levaquin  [Levofloxacin In D5w]    Levofloxacin     Poor Indigestion    Levofloxacin Other (See Comments) and Nausea Only    Poor Indigestion     Metronidazole Nausea And Vomiting   Tramadol Hcl Nausea And Vomiting     Surgical History:  She  has a past surgical history that includes Lung removal, partial; Cholecystectomy (1986); Abdominal hysterectomy (2002); Right rotator cuff surgery (Right, 2009); Bladder tack and sling (2002); Ganglion cyst excision (Right); Appendectomy; Shoulder surgery (Left, 07/2014); Breast cyst aspiration  (Right); Breast cyst excision (Left); Breast excisional biopsy (Right, 1996); Breast excisional biopsy (Right, 1992); Breast excisional biopsy (Left, 1987); Cataract extraction (Bilateral); and Reverse shoulder arthroplasty (Right, 02/05/2023). Family History:  Her family history includes Arthritis in her daughter; Colon polyps in her sister; Diabetes in her brother, maternal aunt, and mother; Heart attack in her brother; Heart failure in her father; Lung cancer in her father; Prostate cancer in her brother; Stomach cancer in her paternal grandfather and paternal grandmother.  REVIEW OF SYSTEMS  : All other systems reviewed and negative except where noted in the History of Present Illness.  PHYSICAL EXAM: BP 138/62   Pulse 60   Ht 5\' 2"  (1.575 m)   Wt 142 lb 9.6 oz (64.7 kg)   BMI 26.08 kg/m  Physical Exam   MEASUREMENTS: Weight- 142.      Edmonia Gottron, PA-C 11:32 AM

## 2023-12-09 ENCOUNTER — Encounter: Payer: Self-pay | Admitting: Physician Assistant

## 2023-12-09 ENCOUNTER — Ambulatory Visit: Admitting: Physician Assistant

## 2023-12-09 VITALS — BP 138/62 | HR 60 | Ht 62.0 in | Wt 142.6 lb

## 2023-12-09 DIAGNOSIS — Z860101 Personal history of adenomatous and serrated colon polyps: Secondary | ICD-10-CM | POA: Diagnosis not present

## 2023-12-09 DIAGNOSIS — E039 Hypothyroidism, unspecified: Secondary | ICD-10-CM | POA: Diagnosis not present

## 2023-12-09 DIAGNOSIS — D123 Benign neoplasm of transverse colon: Secondary | ICD-10-CM

## 2023-12-09 DIAGNOSIS — R109 Unspecified abdominal pain: Secondary | ICD-10-CM

## 2023-12-09 DIAGNOSIS — K5904 Chronic idiopathic constipation: Secondary | ICD-10-CM | POA: Diagnosis not present

## 2023-12-09 NOTE — Patient Instructions (Addendum)
 Follow up with Jasmine Cull PA, on 01/06/24 at 9:20 am   Stop biotin 2 weeks before thyroid  lab work  Linzess  please give this a try, continue on citracel *IBS-C patients may begin to experience relief from belly pain and overall abdominal symptoms (pain, discomfort, and bloating) in about 1 week,  with symptoms typically improving over 12 weeks.  Take at least 30 minutes before the first meal of the day on an empty stomach You can have a loose stool if you eat a high-fat breakfast. Give it at least 7 days, may have more bowel movements during that time.   The diarrhea should go away and you should start having normal, complete, full bowel movements.  It may be helpful to start treatment when you can be near the comfort of your own bathroom, such as a weekend.  After you are out we can send in a prescription if you did well, there is a prescription card  Recommend starting on a fiber supplement, can try metamucil first but if this causes gas/bloating switch to benefiber or citracel, these do not cause gas.  Take with fiber with with a full 8 oz glass of water  once a day. This can take 1 month to start helping, so try for at least one month.  Recommend increasing water  and physical activity.   - Drink at least 64-80 ounces of water /liquid per day. - Establish a time to try to move your bowels every day.  For many people, this is after a cup of coffee or after a meal such as breakfast. - Sit all of the way back on the toilet keeping your back fairly straight and while sitting up, try to rest the tops of your forearms on your upper thighs.   - Raising your feet with a step stool/squatty potty can be helpful to improve the angle that allows your stool to pass through the rectum. - Relax the rectum feeling it bulge toward the toilet water .  If you feel your rectum raising toward your body, you are contracting rather than relaxing. - Breathe in and slowly exhale. "Belly breath" by expanding your  belly towards your belly button. Keep belly expanded as you gently direct pressure down and back to the anus.  A low pitched GRRR sound can assist with increasing intra-abdominal pressure.  (Can also trying to blow on a pinwheel and make it move, this helps with the same belly breathing) - Repeat 3-4 times. If unsuccessful, contract the pelvic floor to restore normal tone and get off the toilet.  Avoid excessive straining. - To reduce excessive wiping by teaching your anus to normally contract, place hands on outer aspect of knees and resist knee movement outward.  Hold 5-10 second then place hands just inside of knees and resist inward movement of knees.  Hold 5 seconds.  Repeat a few times each way.  Go to the ER if unable to pass gas, severe AB pain, unable to hold down food, any shortness of breath of chest pain.  Here some information about pelvic floor dysfunction. This may be contributing to some of your symptoms. We will continue with our evaluation but I do want you to consider adding on fiber supplement with low-dose MiraLAX  daily. We could also refer to pelvic floor physical therapy.   Pelvic Floor Dysfunction, Female Pelvic floor dysfunction (PFD) is a condition that results when the group of muscles and connective tissues that support the organs in the pelvis (pelvic floor muscles) do not  work well. These muscles and their connections form a sling that supports the colon and bladder. In women, they also support the uterus. PFD causes pelvic floor muscles to be too weak, too tight, or both. In PFD, muscle movements are not coordinated. This may cause bowel or bladder problems. It may also cause pain. What are the causes? This condition may be caused by an injury to the pelvic area or by a weakening of pelvic muscles. This often results from pregnancy and childbirth or other types of strain. In many cases, the exact cause is not known. What increases the risk? The following factors may  make you more likely to develop this condition: Having chronic bladder tissue inflammation (interstitial cystitis). Being an older person. Being overweight. History of radiation treatment for cancer in the pelvic region. Previous pelvic surgery, such as removal of the uterus (hysterectomy). What are the signs or symptoms? Symptoms of this condition vary and may include: Bladder symptoms, such as: Trouble starting urination and emptying the bladder. Frequent urinary tract infections. Leaking urine when coughing, laughing, or exercising (stress incontinence). Having to pass urine urgently or frequently. Pain when passing urine. Bowel symptoms, such as: Constipation. Urgent or frequent bowel movements. Incomplete bowel movements. Painful bowel movements. Leaking stool or gas. Unexplained genital or rectal pain. Genital or rectal muscle spasms. Low back pain. Other symptoms may include: A heavy, full, or aching feeling in the vagina. A bulge that protrudes into the vagina. Pain during or after sex. How is this diagnosed? This condition may be diagnosed based on: Your symptoms and medical history. A physical exam. During the exam, your health care provider may check your pelvic muscles for tightness, spasm, pain, or weakness. This may include a rectal exam and a pelvic exam. In some cases, you may have diagnostic tests, such as: Electrical muscle function tests. Urine flow testing. X-ray tests of bowel function. Ultrasound of the pelvic organs. How is this treated? Treatment for this condition depends on the symptoms. Treatment options include: Physical therapy. This may include Kegel exercises to help relax or strengthen the pelvic floor muscles. Biofeedback. This type of therapy provides feedback on how tight your pelvic floor muscles are so that you can learn to control them. Internal or external massage therapy. A treatment that involves electrical stimulation of the pelvic  floor muscles to help control pain (transcutaneous electrical nerve stimulation, or TENS). Sound wave therapy (ultrasound) to reduce muscle spasms. Medicines, such as: Muscle relaxants. Bladder control medicines. Surgery to reconstruct or support pelvic floor muscles may be an option if other treatments do not help. Follow these instructions at home: Activity Do your usual activities as told by your health care provider. Ask your health care provider if you should modify any activities. Do pelvic floor strengthening or relaxing exercises at home as told by your physical therapist. Lifestyle Maintain a healthy weight. Eat foods that are high in fiber, such as beans, whole grains, and fresh fruits and vegetables. Limit foods that are high in fat and processed sugars, such as fried or sweet foods. Manage stress with relaxation techniques such as yoga or meditation. General instructions If you have problems with leakage: Use absorbable pads or wear padded underwear. Wash frequently with mild soap. Keep your genital and anal area as clean and dry as possible. Ask your health care provider if you should try a barrier cream to prevent skin irritation. Take warm baths to relieve pelvic muscle tension or spasms. Take over-the-counter and prescription medicines only  as told by your health care provider. Keep all follow-up visits. How is this prevented? The cause of PFD is not always known, but there are a few things you can do to reduce the risk of developing this condition, including: Staying at a healthy weight. Getting regular exercise. Managing stress. Contact a health care provider if: Your symptoms are not improving with home care. You have signs or symptoms of PFD that get worse at home. You develop new signs or symptoms. You have signs of a urinary tract infection, such as: Fever. Chills. Increased urinary frequency. A burning feeling when urinating. You have not had a bowel  movement in 3 days (constipation). Summary Pelvic floor dysfunction results when the muscles and connective tissues in your pelvic floor do not work well. These muscles and their connections form a sling that supports your colon and bladder. In women, they also support the uterus. PFD may be caused by an injury to the pelvic area or by a weakening of pelvic muscles. PFD causes pelvic floor muscles to be too weak, too tight, or a combination of both. Symptoms may vary from person to person. In most cases, PFD can be treated with physical therapies and medicines. Surgery may be an option if other treatments do not help. This information is not intended to replace advice given to you by your health care provider. Make sure you discuss any questions you have with your health care provider. Document Revised: 11/21/2020 Document Reviewed: 11/21/2020 Elsevier Patient Education  2022 [employer].

## 2024-01-01 DIAGNOSIS — M549 Dorsalgia, unspecified: Secondary | ICD-10-CM | POA: Diagnosis not present

## 2024-01-06 ENCOUNTER — Ambulatory Visit: Admitting: Physician Assistant

## 2024-01-08 DIAGNOSIS — H401211 Low-tension glaucoma, right eye, mild stage: Secondary | ICD-10-CM | POA: Diagnosis not present

## 2024-01-08 DIAGNOSIS — H401222 Low-tension glaucoma, left eye, moderate stage: Secondary | ICD-10-CM | POA: Diagnosis not present

## 2024-01-08 DIAGNOSIS — H04123 Dry eye syndrome of bilateral lacrimal glands: Secondary | ICD-10-CM | POA: Diagnosis not present

## 2024-01-08 DIAGNOSIS — Z961 Presence of intraocular lens: Secondary | ICD-10-CM | POA: Diagnosis not present

## 2024-01-21 DIAGNOSIS — M791 Myalgia, unspecified site: Secondary | ICD-10-CM | POA: Diagnosis not present

## 2024-01-21 DIAGNOSIS — M546 Pain in thoracic spine: Secondary | ICD-10-CM | POA: Diagnosis not present

## 2024-01-21 DIAGNOSIS — K080 Exfoliation of teeth due to systemic causes: Secondary | ICD-10-CM | POA: Diagnosis not present

## 2024-02-01 ENCOUNTER — Ambulatory Visit: Admitting: Physician Assistant

## 2024-02-10 DIAGNOSIS — M6281 Muscle weakness (generalized): Secondary | ICD-10-CM | POA: Diagnosis not present

## 2024-02-10 DIAGNOSIS — M25511 Pain in right shoulder: Secondary | ICD-10-CM | POA: Diagnosis not present

## 2024-02-10 DIAGNOSIS — S233XXD Sprain of ligaments of thoracic spine, subsequent encounter: Secondary | ICD-10-CM | POA: Diagnosis not present

## 2024-02-16 DIAGNOSIS — S233XXD Sprain of ligaments of thoracic spine, subsequent encounter: Secondary | ICD-10-CM | POA: Diagnosis not present

## 2024-02-16 DIAGNOSIS — M6281 Muscle weakness (generalized): Secondary | ICD-10-CM | POA: Diagnosis not present

## 2024-02-19 DIAGNOSIS — M6281 Muscle weakness (generalized): Secondary | ICD-10-CM | POA: Diagnosis not present

## 2024-02-19 DIAGNOSIS — S233XXD Sprain of ligaments of thoracic spine, subsequent encounter: Secondary | ICD-10-CM | POA: Diagnosis not present

## 2024-02-23 DIAGNOSIS — M791 Myalgia, unspecified site: Secondary | ICD-10-CM | POA: Diagnosis not present

## 2024-02-23 DIAGNOSIS — M546 Pain in thoracic spine: Secondary | ICD-10-CM | POA: Diagnosis not present

## 2024-02-23 DIAGNOSIS — M6281 Muscle weakness (generalized): Secondary | ICD-10-CM | POA: Diagnosis not present

## 2024-02-23 DIAGNOSIS — S233XXD Sprain of ligaments of thoracic spine, subsequent encounter: Secondary | ICD-10-CM | POA: Diagnosis not present

## 2024-02-23 DIAGNOSIS — M545 Low back pain, unspecified: Secondary | ICD-10-CM | POA: Diagnosis not present

## 2024-02-26 DIAGNOSIS — S233XXD Sprain of ligaments of thoracic spine, subsequent encounter: Secondary | ICD-10-CM | POA: Diagnosis not present

## 2024-02-26 DIAGNOSIS — M6281 Muscle weakness (generalized): Secondary | ICD-10-CM | POA: Diagnosis not present

## 2024-03-22 DIAGNOSIS — G44209 Tension-type headache, unspecified, not intractable: Secondary | ICD-10-CM | POA: Diagnosis not present

## 2024-03-22 DIAGNOSIS — R7303 Prediabetes: Secondary | ICD-10-CM | POA: Diagnosis not present

## 2024-03-22 DIAGNOSIS — M47815 Spondylosis without myelopathy or radiculopathy, thoracolumbar region: Secondary | ICD-10-CM | POA: Diagnosis not present

## 2024-03-22 DIAGNOSIS — I10 Essential (primary) hypertension: Secondary | ICD-10-CM | POA: Diagnosis not present

## 2024-03-22 DIAGNOSIS — F419 Anxiety disorder, unspecified: Secondary | ICD-10-CM | POA: Diagnosis not present

## 2024-03-23 ENCOUNTER — Other Ambulatory Visit: Payer: Self-pay | Admitting: Family Medicine

## 2024-03-23 DIAGNOSIS — Z1231 Encounter for screening mammogram for malignant neoplasm of breast: Secondary | ICD-10-CM

## 2024-03-30 ENCOUNTER — Ambulatory Visit: Attending: Internal Medicine | Admitting: Internal Medicine

## 2024-03-30 VITALS — BP 112/57 | HR 49 | Ht 62.0 in | Wt 143.0 lb

## 2024-03-30 DIAGNOSIS — R079 Chest pain, unspecified: Secondary | ICD-10-CM

## 2024-03-30 DIAGNOSIS — R011 Cardiac murmur, unspecified: Secondary | ICD-10-CM

## 2024-03-30 DIAGNOSIS — I340 Nonrheumatic mitral (valve) insufficiency: Secondary | ICD-10-CM

## 2024-03-30 DIAGNOSIS — I493 Ventricular premature depolarization: Secondary | ICD-10-CM | POA: Diagnosis not present

## 2024-03-30 NOTE — Patient Instructions (Signed)
 Medication Instructions:  Your physician has recommended you make the following change in your medication:  STOP: metoprolol  succinate  DECREASE: Aspirin  to 81 mg by mouth once daily  *If you need a refill on your cardiac medications before your next appointment, please call your pharmacy*  Lab Work: NONE  If you have labs (blood work) drawn today and your tests are completely normal, you will receive your results only by: MyChart Message (if you have MyChart) OR A paper copy in the mail If you have any lab test that is abnormal or we need to change your treatment, we will call you to review the results.  Testing/Procedures: Your physician has requested that you have an echocardiogram. Echocardiography is a painless test that uses sound waves to create images of your heart. It provides your doctor with information about the size and shape of your heart and how well your heart's chambers and valves are working. This procedure takes approximately one hour. There are no restrictions for this procedure. Please do NOT wear cologne, perfume, aftershave, or lotions (deodorant is allowed). Please arrive 15 minutes prior to your appointment time.  Please note: We ask at that you not bring children with you during ultrasound (echo/ vascular) testing. Due to room size and safety concerns, children are not allowed in the ultrasound rooms during exams. Our front office staff cannot provide observation of children in our lobby area while testing is being conducted. An adult accompanying a patient to their appointment will only be allowed in the ultrasound room at the discretion of the ultrasound technician under special circumstances. We apologize for any inconvenience.   Follow-Up: At [facility], you and your health needs are our priority.  As part of our continuing mission to provide you with exceptional heart care, our providers are all part of one team.  This team includes your primary  Cardiologist (physician) and Advanced Practice Providers or APPs (Physician Assistants and Nurse Practitioners) who all work together to provide you with the care you need, when you need it.  Your next appointment:   1 year(s)  Provider:   Stanly Leavens, MD   We recommend signing up for the patient portal called MyChart.  Sign up information is provided on this After Visit Summary.  MyChart is used to connect with patients for Virtual Visits (Telemedicine).  Patients are able to view lab/test results, encounter notes, upcoming appointments, etc.  Non-urgent messages can be sent to your provider as well.   To learn more about what you can do with MyChart, go to [email or web address].

## 2024-03-30 NOTE — Progress Notes (Signed)
 Cardiology Office Note:  .    Date:  03/30/2024  ID:  Jasmine Smith, DOB October 22, 1935, MRN [id number] PCP: Arloa Elsie SAUNDERS, MD  [facility] Providers Cardiologist:  None     CC: Get checked out Consulted for the evaluation of bradycardia at the behest of Mr. Wonda   History of Present Illness: .    Jasmine Smith is a 88 y.o. female who presents to establish care. She was previously seen by Dr. Pietro in 2012.  She recently underwent shoulder surgery, which went well, though recovery was prolonged. She has a history of mild mitral and aortic regurgitation, first noted in 2012, with the last echocardiogram performed in 2019. She experiences fatigue but no significant chest pain or shortness of breath.  Her hypertension is managed with amlodipine , initially at 5 mg and increased to 10 mg due to previous high blood pressure episodes. She occasionally experiences low blood pressure, fatigue, and feels 'slowed down' with occasional blood pressure spikes.  Her hyperlipidemia is controlled with a low dose of cholesterol medication. She denies significant chest pain or shortness of breath but reports some fatigue.  She has a history of premature ventricular contractions but no recent episodes of palpitations or syncope. Current medications include aspirin  162 mg daily and metoprolol  at the lowest dose. She is unsure of the reason for aspirin  use.  Her daily activities include housework, cooking, and practicing music. She takes two to three naps a day, which she finds helpful.  Discussed the use of AI scribe software for clinical note transcription with the patient, who gave verbal consent to proceed.   Relevant histories: .  Social  - husband is MR. Arentz, my patient ROS: As per HPI.   Studies Reviewed: .     Cardiac Studies & Procedures   ______________________________________________________________________________________________      ECHOCARDIOGRAM  ECHOCARDIOGRAM COMPLETE 01/06/2018  Narrative *[place]* *[facility]* [address] [place], [place] [address] [phone]  ------------------------------------------------------------------- Transthoracic Echocardiography  Patient:    Jasmine, Smith MR #:       [id number] Study Date: 01/06/2018 Gender:     F Age:        82 Height:     160 cm Weight:     65.4 kg BSA:        1.72 m^2 Pt. Status: Room:       3E22C  ATTENDING    Bryn Bernardino KATHEE TISA Bryn Bernardino KATHEE REFERRING    Bryn Bernardino KATHEE PERFORMING   [facility], Inpatient SONOGRAPHER  Elinor Fresh, RDCS ADMITTING    Geralynn Juliene Smith  cc:  ------------------------------------------------------------------- LV EF: 55% -   60%  ------------------------------------------------------------------- History:   PMH:  Abnormal ECG 794.31.  Risk factors:  CVA. PVC&'s. Hypertension. Dyslipidemia.  ------------------------------------------------------------------- Study Conclusions  - Left ventricle: The cavity size was normal. Systolic function was normal. The estimated ejection fraction was in the range of 55% to 60%. Wall motion was normal; there were no regional wall motion abnormalities. Doppler parameters are consistent with abnormal left ventricular relaxation (grade 1 diastolic dysfunction).  ------------------------------------------------------------------- Study data:  No prior study was available for comparison.  Study status:  Routine.  Procedure:  The patient reported no pain pre or post test. Transthoracic echocardiography. Image quality was adequate.  Study completion:  There were no complications. Transthoracic echocardiography.  M-mode, complete 2D, spectral Doppler, and color Doppler.  Birthdate:  Patient birthdate: [birth year].  Age:  Patient is 88  yr old.  Sex:  Gender: female. BMI: 25.5 kg/m^2.  Blood pressure:     140/60  Patient status: Inpatient.  Study  date:  Study date: 01/06/2018. Study time: 10:17 AM.  Location:  Bedside.  -------------------------------------------------------------------  ------------------------------------------------------------------- Left ventricle:  The cavity size was normal. Systolic function was normal. The estimated ejection fraction was in the range of 55% to 60%. Wall motion was normal; there were no regional wall motion abnormalities. Doppler parameters are consistent with abnormal left ventricular relaxation (grade 1 diastolic dysfunction). There was no evidence of elevated ventricular filling pressure by Doppler parameters.  ------------------------------------------------------------------- Aortic valve:   Trileaflet; normal thickness leaflets. Mobility was not restricted.  Doppler:  Transvalvular velocity was within the normal range. There was no stenosis. There was no regurgitation.  ------------------------------------------------------------------- Aorta:  Aortic root: The aortic root was normal in size.  ------------------------------------------------------------------- Mitral valve:   Structurally normal valve.   Mobility was not restricted.  Doppler:  Transvalvular velocity was within the normal range. There was no evidence for stenosis. There was no regurgitation.  ------------------------------------------------------------------- Left atrium:  The atrium was normal in size.  ------------------------------------------------------------------- Right ventricle:  The cavity size was normal. Wall thickness was normal. Systolic function was normal.  ------------------------------------------------------------------- Pulmonic valve:   Poorly visualized.  Doppler:  Transvalvular velocity was within the normal range. There was no evidence for stenosis.  ------------------------------------------------------------------- Tricuspid valve:   Structurally normal valve.     Doppler: Transvalvular velocity was within the normal range. There was no regurgitation.  ------------------------------------------------------------------- Pulmonary artery:   The main pulmonary artery was normal-sized. Systolic pressure was within the normal range.  ------------------------------------------------------------------- Right atrium:  The atrium was normal in size.  ------------------------------------------------------------------- Pericardium:  A prominent pericardial fat pad was present. There was no pericardial effusion.  ------------------------------------------------------------------- Systemic veins: Inferior vena cava: The vessel was normal in size.  ------------------------------------------------------------------- Measurements  Pulmonary arteries                          Value        Reference PA pressure, S, DP                          28     mm Hg <=30  Tricuspid valve                             Value        Reference Tricuspid regurg peak velocity              248.21 cm/s  --------- Tricuspid peak RV-RA gradient               25     mm Hg --------- Tricuspid maximal regurg velocity,          248.21 cm/s  --------- PISA  Systemic veins                              Value        Reference Estimated CVP                               3      mm Hg ---------  Right ventricle  Value        Reference RV pressure, S, DP                          28     mm Hg <=30  Legend: (L)  and  (H)  mark values outside specified reference range.  ------------------------------------------------------------------- Prepared and Electronically Authenticated by  Jerel Balding, MD 2019-06-12T13:40:25          ______________________________________________________________________________________________       Physical Exam:    VS:  BP (!) 112/57   Pulse (!) 49   Ht 5' 2 (1.575 m)   Wt 143 lb (64.9 kg)   SpO2 93%   BMI 26.16  kg/m    Wt Readings from Last 3 Encounters:  03/30/24 143 lb (64.9 kg)  12/09/23 142 lb 9.6 oz (64.7 kg)  11/15/23 144 lb (65.3 kg)    Gen: no distress   Neck: No JVD Cardiac: No Rubs or Gallops, systolis Murmur, regular bradycardia, +2 radial pulses Respiratory: Clear to auscultation bilaterally, normal effort, normal  respiratory rate GI: Soft, nontender, non-distended  MS: No  edema;  moves all extremities Integument: Skin feels warm Neuro:  At time of evaluation, alert and oriented to person/place/time/situation  Psych: Normal affect, patient feels ok   ASSESSMENT AND PLAN: .     An EKG was ordered for bradycardia and shows marked sinus bradycardia  Coronary atherosclerosis with mild left anterior descending artery blockage Mild blockage in the left anterior descending artery noted on previous CT scan. No current symptoms of chest pain or shortness of breath. Current management with aspirin  is deemed reasonable due to the presence of plaque. Studies indicate that a lower dose of aspirin  is equally effective and reduces bleeding risk. - Reduce aspirin  dose to 81 mg daily to maintain efficacy while reducing bleeding risk.  Hypertension Blood pressure has been fluctuating with episodes of high and low readings. Currently on amlodipine  and losartan . Blood pressure is well-controlled today. If blood pressure becomes persistently elevated, switching to irbesartan is preferred over increasing the number of medications. - Continue current dose of amlodipine . - If blood pressure becomes persistently elevated, switch losartan  to irbesartan.  Hyperlipidemia Cholesterol levels are reasonably well-controlled with current medication. Presence of aortic atherosclerosis noted, but no changes needed at this time. Personally reviewed imaging with her and her husband - Continue current medication.  Premature ventricular contractions Iatrogenic bradycardia (medication-induced) Bradycardia  likely induced by metoprolol , which is suppressing heart rate. No significant symptoms of coronary artery disease to warrant continued use of metoprolol . - Discontinue metoprolol .  History of mild mitral regurgitation and mild aortic regurgitation Previously diagnosed with mild mitral and aortic regurgitation. Last echocardiogram was in 2019. Current examination reveals a heart murmur, possibly due to slow heart rate. - Order echocardiogram to reassess heart valve function.  One year with me  Stanly Leavens, MD FASE [facility] Cardiologist [facility]  [address] [place], [address] [place], [place] [address] [phone]  12:55 PM

## 2024-04-05 DIAGNOSIS — J029 Acute pharyngitis, unspecified: Secondary | ICD-10-CM | POA: Diagnosis not present

## 2024-04-11 DIAGNOSIS — R052 Subacute cough: Secondary | ICD-10-CM | POA: Diagnosis not present

## 2024-04-22 DIAGNOSIS — R053 Chronic cough: Secondary | ICD-10-CM | POA: Diagnosis not present

## 2024-04-22 DIAGNOSIS — J0100 Acute maxillary sinusitis, unspecified: Secondary | ICD-10-CM | POA: Diagnosis not present

## 2024-04-27 DIAGNOSIS — R002 Palpitations: Secondary | ICD-10-CM | POA: Diagnosis not present

## 2024-04-28 ENCOUNTER — Ambulatory Visit

## 2024-05-04 ENCOUNTER — Ambulatory Visit (HOSPITAL_COMMUNITY)
Admission: RE | Admit: 2024-05-04 | Discharge: 2024-05-04 | Disposition: A | Discharge status: Home / Self Care | Source: Ambulatory Visit | Attending: Cardiology | Admitting: Cardiology

## 2024-05-04 DIAGNOSIS — R011 Cardiac murmur, unspecified: Secondary | ICD-10-CM | POA: Diagnosis not present

## 2024-05-04 DIAGNOSIS — F419 Anxiety disorder, unspecified: Secondary | ICD-10-CM | POA: Diagnosis not present

## 2024-05-04 DIAGNOSIS — I340 Nonrheumatic mitral (valve) insufficiency: Secondary | ICD-10-CM

## 2024-05-04 DIAGNOSIS — G44209 Tension-type headache, unspecified, not intractable: Secondary | ICD-10-CM | POA: Diagnosis not present

## 2024-05-04 DIAGNOSIS — M47815 Spondylosis without myelopathy or radiculopathy, thoracolumbar region: Secondary | ICD-10-CM | POA: Diagnosis not present

## 2024-05-04 DIAGNOSIS — Z23 Encounter for immunization: Secondary | ICD-10-CM | POA: Diagnosis not present

## 2024-05-05 ENCOUNTER — Ambulatory Visit: Payer: Self-pay | Admitting: Internal Medicine

## 2024-05-05 LAB — ECHOCARDIOGRAM COMPLETE
Area-P 1/2: 3.15 cm2
P 1/2 time: 669 ms
S' Lateral: 2 cm

## 2024-05-17 DIAGNOSIS — J329 Chronic sinusitis, unspecified: Secondary | ICD-10-CM | POA: Diagnosis not present

## 2024-05-17 DIAGNOSIS — R062 Wheezing: Secondary | ICD-10-CM | POA: Diagnosis not present

## 2024-05-19 DIAGNOSIS — L570 Actinic keratosis: Secondary | ICD-10-CM | POA: Diagnosis not present

## 2024-05-19 DIAGNOSIS — Z96611 Presence of right artificial shoulder joint: Secondary | ICD-10-CM | POA: Diagnosis not present

## 2024-05-19 DIAGNOSIS — J069 Acute upper respiratory infection, unspecified: Secondary | ICD-10-CM | POA: Diagnosis not present

## 2024-05-20 ENCOUNTER — Ambulatory Visit

## 2024-05-30 DIAGNOSIS — R091 Pleurisy: Secondary | ICD-10-CM | POA: Diagnosis not present

## 2024-06-03 ENCOUNTER — Ambulatory Visit

## 2024-06-22 ENCOUNTER — Ambulatory Visit
Admission: RE | Admit: 2024-06-22 | Discharge: 2024-06-22 | Disposition: A | Discharge status: Home / Self Care | Source: Ambulatory Visit | Attending: Family Medicine | Admitting: Family Medicine

## 2024-06-22 DIAGNOSIS — Z1231 Encounter for screening mammogram for malignant neoplasm of breast: Secondary | ICD-10-CM

## 2024-06-30 DIAGNOSIS — H401222 Low-tension glaucoma, left eye, moderate stage: Secondary | ICD-10-CM | POA: Diagnosis not present

## 2024-06-30 DIAGNOSIS — Z961 Presence of intraocular lens: Secondary | ICD-10-CM | POA: Diagnosis not present

## 2024-06-30 DIAGNOSIS — H04123 Dry eye syndrome of bilateral lacrimal glands: Secondary | ICD-10-CM | POA: Diagnosis not present

## 2024-06-30 DIAGNOSIS — H401211 Low-tension glaucoma, right eye, mild stage: Secondary | ICD-10-CM | POA: Diagnosis not present

## 2024-07-26 DIAGNOSIS — K080 Exfoliation of teeth due to systemic causes: Secondary | ICD-10-CM | POA: Diagnosis not present
